# Patient Record
Sex: Male | Born: 1980 | Race: White | Hispanic: No | Marital: Married | Smoking: Never smoker
Health system: Southern US, Community
[De-identification: ages and names within clinical notes are randomized; demographics above are authoritative.]

## PROBLEM LIST (undated history)

## (undated) DIAGNOSIS — I10 Essential (primary) hypertension: Secondary | ICD-10-CM

## (undated) DIAGNOSIS — R42 Dizziness and giddiness: Secondary | ICD-10-CM

## (undated) DIAGNOSIS — T7840XA Allergy, unspecified, initial encounter: Secondary | ICD-10-CM

## (undated) HISTORY — PX: APPENDECTOMY: SHX54

## (undated) HISTORY — DX: Allergy, unspecified, initial encounter: T78.40XA

## (undated) HISTORY — DX: Essential (primary) hypertension: I10

## (undated) HISTORY — DX: Dizziness and giddiness: R42

## (undated) HISTORY — PX: VASECTOMY REVERSAL: SHX243

## (undated) HISTORY — PX: VASECTOMY: SHX75

---

## 2004-02-12 ENCOUNTER — Other Ambulatory Visit: Payer: Self-pay

## 2004-02-12 ENCOUNTER — Emergency Department: Payer: Self-pay | Admitting: Emergency Medicine

## 2005-12-23 ENCOUNTER — Ambulatory Visit: Payer: Self-pay | Admitting: Orthopedic Surgery

## 2006-10-13 ENCOUNTER — Other Ambulatory Visit: Payer: Self-pay

## 2006-10-14 ENCOUNTER — Inpatient Hospital Stay: Payer: Self-pay | Admitting: Internal Medicine

## 2006-12-27 ENCOUNTER — Ambulatory Visit: Payer: Self-pay | Admitting: Specialist

## 2007-01-11 ENCOUNTER — Ambulatory Visit: Payer: Self-pay | Admitting: Specialist

## 2007-02-10 ENCOUNTER — Ambulatory Visit: Payer: Self-pay | Admitting: Internal Medicine

## 2007-02-10 ENCOUNTER — Ambulatory Visit: Payer: Self-pay | Admitting: General Surgery

## 2012-07-12 ENCOUNTER — Ambulatory Visit: Payer: Self-pay | Admitting: Family Medicine

## 2014-12-10 ENCOUNTER — Encounter: Payer: Self-pay | Admitting: Family Medicine

## 2014-12-10 ENCOUNTER — Ambulatory Visit
Admission: RE | Admit: 2014-12-10 | Discharge: 2014-12-10 | Disposition: A | Discharge status: Home / Self Care | Payer: 59 | Source: Ambulatory Visit | Attending: Family Medicine | Admitting: Family Medicine

## 2014-12-10 ENCOUNTER — Ambulatory Visit: Payer: Self-pay | Admitting: Family Medicine

## 2014-12-10 ENCOUNTER — Ambulatory Visit (INDEPENDENT_AMBULATORY_CARE_PROVIDER_SITE_OTHER): Payer: 59 | Admitting: Family Medicine

## 2014-12-10 VITALS — BP 148/88 | HR 79 | Temp 98.0°F | Resp 16 | Ht 67.0 in | Wt 248.2 lb

## 2014-12-10 DIAGNOSIS — IMO0001 Reserved for inherently not codable concepts without codable children: Secondary | ICD-10-CM

## 2014-12-10 DIAGNOSIS — S39012A Strain of muscle, fascia and tendon of lower back, initial encounter: Secondary | ICD-10-CM

## 2014-12-10 DIAGNOSIS — S338XXA Sprain of other parts of lumbar spine and pelvis, initial encounter: Secondary | ICD-10-CM

## 2014-12-10 DIAGNOSIS — S3992XS Unspecified injury of lower back, sequela: Secondary | ICD-10-CM | POA: Diagnosis not present

## 2014-12-10 DIAGNOSIS — L255 Unspecified contact dermatitis due to plants, except food: Secondary | ICD-10-CM

## 2014-12-10 DIAGNOSIS — R030 Elevated blood-pressure reading, without diagnosis of hypertension: Secondary | ICD-10-CM | POA: Diagnosis not present

## 2014-12-10 DIAGNOSIS — X58XXXA Exposure to other specified factors, initial encounter: Secondary | ICD-10-CM | POA: Diagnosis not present

## 2014-12-10 MED ORDER — CYCLOBENZAPRINE HCL 10 MG PO TABS: 10.0000 mg | ORAL_TABLET | Freq: Three times a day (TID) | ORAL | Status: DC | PRN

## 2014-12-10 MED ORDER — TRIAMCINOLONE ACETONIDE 0.1 % EX CREA: 1.0000 "application " | TOPICAL_CREAM | Freq: Two times a day (BID) | CUTANEOUS | Status: AC

## 2014-12-10 MED ORDER — MELOXICAM 7.5 MG PO TABS: 15.0000 mg | ORAL_TABLET | Freq: Every day | ORAL | Status: DC

## 2014-12-10 NOTE — Patient Instructions (Addendum)
If you develop progressive numbness, weakness, loss of feeling in your pelvis, or loss of bowel/bladder control please seek immediate medical attention.   We discussed potential pathology and long term risk of reoccurrence. Maintaining an ideal body habitus, regular exercise, proper lifting techniques and mindfulness of exacerbating factors will be useful in long term management.  Instructed on use of heating pad with exercises. Consider concomitant therapy with PT, massage therapist or chiropractor. May use anti-inflammatory medication and muscle relaxer as needed.  Back Exercises These exercises may help you when beginning to rehabilitate your injury. Your symptoms may resolve with or without further involvement from your physician, physical therapist or athletic trainer. While completing these exercises, remember:   Restoring tissue flexibility helps normal motion to return to the joints. This allows healthier, less painful movement and activity.  An effective stretch should be held for at least 30 seconds.  A stretch should never be painful. You should only feel a gentle lengthening or release in the stretched tissue. STRETCH - Extension, Prone on Elbows   Lie on your stomach on the floor, a bed will be too soft. Place your palms about shoulder width apart and at the height of your head.  Place your elbows under your shoulders. If this is too painful, stack pillows under your chest.  Allow your body to relax so that your hips drop lower and make contact more completely with the floor.  Hold this position for __________ seconds.  Slowly return to lying flat on the floor. Repeat __________ times. Complete this exercise __________ times per day.  RANGE OF MOTION - Extension, Prone Press Ups   Lie on your stomach on the floor, a bed will be too soft. Place your palms about shoulder width apart and at the height of your head.  Keeping your back as relaxed as possible, slowly straighten  your elbows while keeping your hips on the floor. You may adjust the placement of your hands to maximize your comfort. As you gain motion, your hands will come more underneath your shoulders.  Hold this position __________ seconds.  Slowly return to lying flat on the floor. Repeat __________ times. Complete this exercise __________ times per day.  RANGE OF MOTION- Quadruped, Neutral Spine   Assume a hands and knees position on a firm surface. Keep your hands under your shoulders and your knees under your hips. You may place padding under your knees for comfort.  Drop your head and point your tail bone toward the ground below you. This will round out your low back like an angry cat. Hold this position for __________ seconds.  Slowly lift your head and release your tail bone so that your back sags into a large arch, like an old horse.  Hold this position for __________ seconds.  Repeat this until you feel limber in your low back.  Now, find your "sweet spot." This will be the most comfortable position somewhere between the two previous positions. This is your neutral spine. Once you have found this position, tense your stomach muscles to support your low back.  Hold this position for __________ seconds. Repeat __________ times. Complete this exercise __________ times per day.  STRETCH - Flexion, Single Knee to Chest   Lie on a firm bed or floor with both legs extended in front of you.  Keeping one leg in contact with the floor, bring your opposite knee to your chest. Hold your leg in place by either grabbing behind your thigh or at your  knee.  Pull until you feel a gentle stretch in your low back. Hold __________ seconds.  Slowly release your grasp and repeat the exercise with the opposite side. Repeat __________ times. Complete this exercise __________ times per day.  STRETCH - Hamstrings, Standing  Stand or sit and extend your right / left leg, placing your foot on a chair or foot  stool  Keeping a slight arch in your low back and your hips straight forward.  Lead with your chest and lean forward at the waist until you feel a gentle stretch in the back of your right / left knee or thigh. (When done correctly, this exercise requires leaning only a small distance.)  Hold this position for __________ seconds. Repeat __________ times. Complete this stretch __________ times per day. STRENGTHENING - Deep Abdominals, Pelvic Tilt   Lie on a firm bed or floor. Keeping your legs in front of you, bend your knees so they are both pointed toward the ceiling and your feet are flat on the floor.  Tense your lower abdominal muscles to press your low back into the floor. This motion will rotate your pelvis so that your tail bone is scooping upwards rather than pointing at your feet or into the floor.  With a gentle tension and even breathing, hold this position for __________ seconds. Repeat __________ times. Complete this exercise __________ times per day.  STRENGTHENING - Abdominals, Crunches   Lie on a firm bed or floor. Keeping your legs in front of you, bend your knees so they are both pointed toward the ceiling and your feet are flat on the floor. Cross your arms over your chest.  Slightly tip your chin down without bending your neck.  Tense your abdominals and slowly lift your trunk high enough to just clear your shoulder blades. Lifting higher can put excessive stress on the low back and does not further strengthen your abdominal muscles.  Control your return to the starting position. Repeat __________ times. Complete this exercise __________ times per day.  STRENGTHENING - Quadruped, Opposite UE/LE Lift   Assume a hands and knees position on a firm surface. Keep your hands under your shoulders and your knees under your hips. You may place padding under your knees for comfort.  Find your neutral spine and gently tense your abdominal muscles so that you can maintain this  position. Your shoulders and hips should form a rectangle that is parallel with the floor and is not twisted.  Keeping your trunk steady, lift your right hand no higher than your shoulder and then your left leg no higher than your hip. Make sure you are not holding your breath. Hold this position __________ seconds.  Continuing to keep your abdominal muscles tense and your back steady, slowly return to your starting position. Repeat with the opposite arm and leg. Repeat __________ times. Complete this exercise __________ times per day. Document Released: 04/24/2005 Document Revised: 06/29/2011 Document Reviewed: 07/19/2008 [facility] Patient Information 2015 [place], [place]. This information is not intended to replace advice given to you by your health care provider. Make sure you discuss any questions you have with your health care provider. Back Exercises These exercises may help you when beginning to rehabilitate your injury. Your symptoms may resolve with or without further involvement from your physician, physical therapist or athletic trainer. While completing these exercises, remember:   Restoring tissue flexibility helps normal motion to return to the joints. This allows healthier, less painful movement and activity.  An effective stretch  should be held for at least 30 seconds.  A stretch should never be painful. You should only feel a gentle lengthening or release in the stretched tissue. STRETCH - Extension, Prone on Elbows   Lie on your stomach on the floor, a bed will be too soft. Place your palms about shoulder width apart and at the height of your head.  Place your elbows under your shoulders. If this is too painful, stack pillows under your chest.  Allow your body to relax so that your hips drop lower and make contact more completely with the floor.  Hold this position for __________ seconds.  Slowly return to lying flat on the floor. Repeat __________ times. Complete this  exercise __________ times per day.  RANGE OF MOTION - Extension, Prone Press Ups   Lie on your stomach on the floor, a bed will be too soft. Place your palms about shoulder width apart and at the height of your head.  Keeping your back as relaxed as possible, slowly straighten your elbows while keeping your hips on the floor. You may adjust the placement of your hands to maximize your comfort. As you gain motion, your hands will come more underneath your shoulders.  Hold this position __________ seconds.  Slowly return to lying flat on the floor. Repeat __________ times. Complete this exercise __________ times per day.  RANGE OF MOTION- Quadruped, Neutral Spine   Assume a hands and knees position on a firm surface. Keep your hands under your shoulders and your knees under your hips. You may place padding under your knees for comfort.  Drop your head and point your tail bone toward the ground below you. This will round out your low back like an angry cat. Hold this position for __________ seconds.  Slowly lift your head and release your tail bone so that your back sags into a large arch, like an old horse.  Hold this position for __________ seconds.  Repeat this until you feel limber in your low back.  Now, find your "sweet spot." This will be the most comfortable position somewhere between the two previous positions. This is your neutral spine. Once you have found this position, tense your stomach muscles to support your low back.  Hold this position for __________ seconds. Repeat __________ times. Complete this exercise __________ times per day.  STRETCH - Flexion, Single Knee to Chest   Lie on a firm bed or floor with both legs extended in front of you.  Keeping one leg in contact with the floor, bring your opposite knee to your chest. Hold your leg in place by either grabbing behind your thigh or at your knee.  Pull until you feel a gentle stretch in your low back. Hold __________  seconds.  Slowly release your grasp and repeat the exercise with the opposite side. Repeat __________ times. Complete this exercise __________ times per day.  STRETCH - Hamstrings, Standing  Stand or sit and extend your right / left leg, placing your foot on a chair or foot stool  Keeping a slight arch in your low back and your hips straight forward.  Lead with your chest and lean forward at the waist until you feel a gentle stretch in the back of your right / left knee or thigh. (When done correctly, this exercise requires leaning only a small distance.)  Hold this position for __________ seconds. Repeat __________ times. Complete this stretch __________ times per day. STRENGTHENING - Deep Abdominals, Pelvic Tilt   Lorenz Coaster  on a firm bed or floor. Keeping your legs in front of you, bend your knees so they are both pointed toward the ceiling and your feet are flat on the floor.  Tense your lower abdominal muscles to press your low back into the floor. This motion will rotate your pelvis so that your tail bone is scooping upwards rather than pointing at your feet or into the floor.  With a gentle tension and even breathing, hold this position for __________ seconds. Repeat __________ times. Complete this exercise __________ times per day.  STRENGTHENING - Abdominals, Crunches   Lie on a firm bed or floor. Keeping your legs in front of you, bend your knees so they are both pointed toward the ceiling and your feet are flat on the floor. Cross your arms over your chest.  Slightly tip your chin down without bending your neck.  Tense your abdominals and slowly lift your trunk high enough to just clear your shoulder blades. Lifting higher can put excessive stress on the low back and does not further strengthen your abdominal muscles.  Control your return to the starting position. Repeat __________ times. Complete this exercise __________ times per day.  STRENGTHENING - Quadruped, Opposite UE/LE  Lift   Assume a hands and knees position on a firm surface. Keep your hands under your shoulders and your knees under your hips. You may place padding under your knees for comfort.  Find your neutral spine and gently tense your abdominal muscles so that you can maintain this position. Your shoulders and hips should form a rectangle that is parallel with the floor and is not twisted.  Keeping your trunk steady, lift your right hand no higher than your shoulder and then your left leg no higher than your hip. Make sure you are not holding your breath. Hold this position __________ seconds.  Continuing to keep your abdominal muscles tense and your back steady, slowly return to your starting position. Repeat with the opposite arm and leg. Repeat __________ times. Complete this exercise __________ times per day. Document Released: 04/24/2005 Document Revised: 06/29/2011 Document Reviewed: 07/19/2008 [facility] Patient Information 2015 [place], [place]. This information is not intended to replace advice given to you by your health care provider. Make sure you discuss any questions you have with your health care provider.

## 2014-12-10 NOTE — Progress Notes (Signed)
Subjective:    Patient ID: Jack Lewis, male    DOB: 01-08-1981, 34 y.o.   MRN: [id number]  HPI: Jack Lewis is a 34 y.o. male presenting on 12/10/2014 for Back Pain and Rash   Back Pain This is a recurrent problem. The current episode started more than 1 month ago. The problem occurs intermittently. The problem has been gradually worsening since onset. The pain is present in the lumbar spine. The quality of the pain is described as aching. The pain does not radiate. The pain is at a severity of 7/10. The pain is worse during the day. The symptoms are aggravated by standing. Pertinent negatives include no abdominal pain, bladder incontinence, bowel incontinence, chest pain, fever, numbness, paresthesias, pelvic pain, perianal numbness or weakness. Risk factors include recent trauma. He has tried bed rest and heat for the symptoms. The treatment provided mild relief.  Rash The current episode started in the past 7 days. The problem has been gradually improving since onset. The affected locations include the neck. The rash is characterized by redness and itchiness. He was exposed to plant contact. Pertinent negatives include no congestion, diarrhea, eye pain, facial edema, fever, joint pain, rhinorrhea, shortness of breath, sore throat or vomiting. Past treatments include anti-itch cream.   Pt presents for 6 mos history of back pain. 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 Back was snapped back at lumbar section. Pt reports intermittent back pain since that point. Episode of back pain are getting more frequent. Pain is located in the mid back. Denies numbness or tingling, loss of bowel or bladder control. Standing in the worst position. Pt is using pillow   Past Medical History  Diagnosis Date  . Allergy   . Vertigo     No current outpatient prescriptions on file prior to visit.   No current facility-administered medications on file prior to visit.    Review  of Systems  Constitutional: Negative for fever.  HENT: Negative for congestion, rhinorrhea and sore throat.   Eyes: Negative for pain.  Respiratory: Negative for shortness of breath.   Cardiovascular: Negative for chest pain.  Gastrointestinal: Negative for vomiting, abdominal pain, diarrhea and bowel incontinence.  Genitourinary: Negative for bladder incontinence and pelvic pain.  Musculoskeletal: Positive for back pain. Negative for joint pain.  Skin: Positive for rash.  Neurological: Negative for dizziness, weakness, numbness and paresthesias.   Per HPI unless specifically indicated above     Objective:    BP 148/88 mmHg  Pulse 79  Temp(Src) 98 F (36.7 C) (Oral)  Resp 16  Ht 5\' 7"  (1.702 m)  Wt 248 lb 3.2 oz (112.583 kg)  BMI 38.86 kg/m2  Wt Readings from Last 3 Encounters:  12/10/14 248 lb 3.2 oz (112.583 kg)    Physical Exam  Constitutional: He is oriented to person, place, and time. He appears well-developed and well-nourished. No distress.  Pulmonary/Chest: Effort normal and breath sounds normal. He has no wheezes. He exhibits no tenderness.  Musculoskeletal:       Lumbar back: He exhibits tenderness. He exhibits normal range of motion, no edema and no pain.       Back:  Neurological: He is alert and oriented to person, place, and time. He has normal strength and normal reflexes. No cranial nerve deficit or sensory deficit. He displays a negative Romberg sign.  Skin: He is not diaphoretic.   No results found for this or any previous visit.  Assessment & Plan:   Problem List Items Addressed This Visit    None    Visit Diagnoses    Lumbosacral strain, initial encounter    -  Primary    Supportive care- NSAIDs, muscle relaxers. Referral to PT placed. Pt also would like to see chiropractor. Encourage core strengthening. Rest, heat, massage.    Relevant Medications    meloxicam (MOBIC) 7.5 MG tablet    cyclobenzaprine (FLEXERIL) 10 MG tablet    Other Relevant  Orders    DG Lumbar Spine Complete    Contact dermatitis due to plant        Kenalog cream BID for 1 week.     Relevant Medications    triamcinolone cream (KENALOG) 0.1 %    Back injuries, sequela        XR to rule out bone injury from initial snap from work recliner.     Elevated BP        Likely elevated due to pain. Encouraged pt to continue to monitor BP at home. Encouraged healthy weight and avoidance of salt.        Meds ordered this encounter  Medications  . fexofenadine (ALLEGRA) 30 MG tablet    Sig: Take 30 mg by mouth daily.  Marland Kitchen triamcinolone cream (KENALOG) 0.1 %    Sig: Apply 1 application topically 2 (two) times daily.    Dispense:  30 g    Refill:  0    Order Specific Question:  Supervising Provider    Answer:  Janeann Forehand [[id number]]  . meloxicam (MOBIC) 7.5 MG tablet    Sig: Take 2 tablets (15 mg total) by mouth daily.    Dispense:  60 tablet    Refill:  0    Order Specific Question:  Supervising Provider    Answer:  Janeann Forehand [phone]  . cyclobenzaprine (FLEXERIL) 10 MG tablet    Sig: Take 1 tablet (10 mg total) by mouth 3 (three) times daily as needed for muscle spasms.    Dispense:  30 tablet    Refill:  1    Order Specific Question:  Supervising Provider    Answer:  Janeann Forehand [phone]      Follow up plan: Return if symptoms worsen or fail to improve, for back pain.

## 2014-12-11 ENCOUNTER — Telehealth: Payer: Self-pay

## 2014-12-11 NOTE — Telephone Encounter (Signed)
Advised XRay Results.

## 2014-12-11 NOTE — Telephone Encounter (Signed)
-----   Message from Loura Pardon, NP sent at 12/11/2014 10:26 AM EDT ----- Please let Jack Lewis know that his back XR showed no sign of fracture of degenerative changes. His symptoms are consistently with muscular strain. We will continue with plan of care discussed at his visit. Thanks! AK

## 2015-07-30 ENCOUNTER — Ambulatory Visit (INDEPENDENT_AMBULATORY_CARE_PROVIDER_SITE_OTHER): Payer: 59 | Admitting: Family Medicine

## 2015-07-30 ENCOUNTER — Encounter: Payer: Self-pay | Admitting: Family Medicine

## 2015-07-30 VITALS — BP 150/90 | HR 75 | Temp 98.2°F | Resp 16 | Ht 67.0 in | Wt 256.0 lb

## 2015-07-30 DIAGNOSIS — I10 Essential (primary) hypertension: Secondary | ICD-10-CM | POA: Insufficient documentation

## 2015-07-30 DIAGNOSIS — Z0000 Encounter for general adult medical examination without abnormal findings: Secondary | ICD-10-CM

## 2015-07-30 MED ORDER — LISINOPRIL 10 MG PO TABS: 10.0000 mg | ORAL_TABLET | Freq: Every day | ORAL | Status: DC

## 2015-07-30 NOTE — Progress Notes (Signed)
Name: Jack Lewis   MRN: [id number]    DOB: [birth year]   Date:07/30/2015       Progress Note  Subjective  Chief Complaint  Chief Complaint  Patient presents with  . Annual Exam    HPI Here for health maintenance physical.  His only problem is low testosterone level and he and his wife have infertility problems.  He suffers from seasonal allergies.  He has been told that his BP has been a little high at recent office visits.  No problem-specific assessment & plan notes found for this encounter.   Past Medical History  Diagnosis Date  . Allergy   . Vertigo     History reviewed. No pertinent past surgical history.  Family History  Problem Relation Age of Onset  . Hypertension Mother   . Diabetes Father   . Hypertension Father     Social History   Social History  . Marital Status: Married    Spouse Name: N/A  . Number of Children: N/A  . Years of Education: N/A   Occupational History  . Not on file.   Social History Main Topics  . Smoking status: Never Smoker   . Smokeless tobacco: Never Used  . Alcohol Use: 0.0 oz/week    0 Standard drinks or equivalent per week  . Drug Use: No  . Sexual Activity: Not on file   Other Topics Concern  . Not on file   Social History Narrative     Current outpatient prescriptions:  .  clomiPHENE (CLOMID) 50 MG tablet, Take 50 mg by mouth daily., Disp: , Rfl:  .  cyclobenzaprine (FLEXERIL) 10 MG tablet, Take 1 tablet (10 mg total) by mouth 3 (three) times daily as needed for muscle spasms. (Patient not taking: Reported on 07/30/2015), Disp: 30 tablet, Rfl: 1 .  fexofenadine (ALLEGRA) 30 MG tablet, Take 30 mg by mouth daily. Reported on 07/30/2015, Disp: , Rfl:  .  lisinopril (PRINIVIL,ZESTRIL) 10 MG tablet, Take 1 tablet (10 mg total) by mouth daily., Disp: 30 tablet, Rfl: 6 .  meloxicam (MOBIC) 7.5 MG tablet, Take 2 tablets (15 mg total) by mouth daily. (Patient not taking: Reported on 07/30/2015), Disp: 60 tablet, Rfl: 0  No  Known Allergies   Review of Systems  Constitutional: Negative for fever, chills, weight loss and malaise/fatigue.  HENT: Negative for hearing loss.   Eyes: Negative for blurred vision and double vision.  Respiratory: Negative for cough, shortness of breath and wheezing.   Cardiovascular: Negative for chest pain, palpitations and leg swelling.  Gastrointestinal: Negative for heartburn, abdominal pain and blood in stool.  Genitourinary: Negative for dysuria, urgency and frequency.  Skin: Negative for rash.  Neurological: Negative for dizziness, tremors, weakness and headaches.      Objective  Filed Vitals:   07/30/15 1014 07/30/15 1049  BP: 149/84 150/90  Pulse: 75   Temp: 98.2 F (36.8 C)   TempSrc: Oral   Resp: 16   Height:  (1.702 m)   Weight: 256 lb (116.121 kg)     Physical Exam  Constitutional: He is well-developed, well-nourished, and in no distress. No distress.  HENT:  Head: Normocephalic and atraumatic.  Right Ear: External ear normal.  Left Ear: External ear normal.  Nose: Nose normal.  Mouth/Throat: Oropharynx is clear and moist.  Eyes: Conjunctivae and EOM are normal. Pupils are equal, round, and reactive to light. No scleral icterus.  Neck: Normal range of motion. Neck supple. Carotid bruit is not present.  No thyromegaly present.  Cardiovascular: Normal rate, regular rhythm, normal heart sounds and intact distal pulses.  Exam reveals no gallop and no friction rub.   No murmur heard. Pulmonary/Chest: Effort normal and breath sounds normal. No respiratory distress. He has no wheezes. He has no rales.  Abdominal: Soft. Bowel sounds are normal. He exhibits no distension, no abdominal bruit and no mass. There is no tenderness.  Genitourinary: Penis normal. No discharge found.  Musculoskeletal: Normal range of motion. He exhibits no edema.  Lymphadenopathy:    He has no cervical adenopathy.  Neurological: He is alert. No cranial nerve deficit. Gait normal.  Coordination normal.  Skin: Skin is warm and dry. No rash noted. No erythema. No pallor.  Psychiatric: Mood, memory, affect and judgment normal.  Vitals reviewed.      No results found for this or any previous visit (from the past 2160 hour(s)).   Assessment & Plan  Problem List Items Addressed This Visit      Cardiovascular and Mediastinum   HBP (high blood pressure)   Relevant Medications   lisinopril (PRINIVIL,ZESTRIL) 10 MG tablet     Other   Health maintenance examination - Primary      Meds ordered this encounter  Medications  . clomiPHENE (CLOMID) 50 MG tablet    Sig: Take 50 mg by mouth daily.  Marland Kitchen. lisinopril (PRINIVIL,ZESTRIL) 10 MG tablet    Sig: Take 1 tablet (10 mg total) by mouth daily.    Dispense:  30 tablet    Refill:  6  1. Health maintenance examination   2. Essential hypertension  - lisinopril (PRINIVIL,ZESTRIL) 10 MG tablet; Take 1 tablet (10 mg total) by mouth daily.  Dispense: 30 tablet; Refill: 6 RTC-6 weeks

## 2015-09-05 ENCOUNTER — Ambulatory Visit: Payer: 59 | Admitting: Family Medicine

## 2015-09-24 ENCOUNTER — Ambulatory Visit (INDEPENDENT_AMBULATORY_CARE_PROVIDER_SITE_OTHER): Payer: 59 | Admitting: Family Medicine

## 2015-09-24 ENCOUNTER — Encounter: Payer: Self-pay | Admitting: Family Medicine

## 2015-09-24 VITALS — BP 135/85 | HR 75 | Temp 98.3°F | Resp 16 | Ht 67.0 in | Wt 244.0 lb

## 2015-09-24 DIAGNOSIS — I10 Essential (primary) hypertension: Secondary | ICD-10-CM

## 2015-09-24 NOTE — Progress Notes (Signed)
Name: Jack Lewis   MRN: [id number]    DOB: 06/24/1980   Date:09/24/2015       Progress Note  Subjective  Chief Complaint  Chief Complaint  Patient presents with  . Hypertension    HPI Here for f/u of HBP.  He stopped Lisinopril after a few dayhs because he said it made him feel tired.  He has lost weight by changing his diet.  No problem-specific assessment & plan notes found for this encounter.   Past Medical History  Diagnosis Date  . Allergy   . Vertigo     History reviewed. No pertinent past surgical history.  Family History  Problem Relation Age of Onset  . Hypertension Mother   . Diabetes Father   . Hypertension Father     Social History   Social History  . Marital Status: Married    Spouse Name: N/A  . Number of Children: N/A  . Years of Education: N/A   Occupational History  . Not on file.   Social History Main Topics  . Smoking status: Never Smoker   . Smokeless tobacco: Never Used  . Alcohol Use: 0.0 oz/week    0 Standard drinks or equivalent per week  . Drug Use: No  . Sexual Activity: Not on file   Other Topics Concern  . Not on file   Social History Narrative     Current outpatient prescriptions:  .  clomiPHENE (CLOMID) 50 MG tablet, Take 50 mg by mouth daily., Disp: , Rfl:  .  fexofenadine (ALLEGRA) 30 MG tablet, Take 30 mg by mouth daily. Reported on 07/30/2015, Disp: , Rfl:  .  cyclobenzaprine (FLEXERIL) 10 MG tablet, Take 1 tablet (10 mg total) by mouth 3 (three) times daily as needed for muscle spasms. (Patient not taking: Reported on 07/30/2015), Disp: 30 tablet, Rfl: 1  No Known Allergies   Review of Systems  Constitutional: Negative for fever, chills, weight loss and malaise/fatigue.  HENT: Negative for hearing loss.   Eyes: Negative for blurred vision and double vision.  Respiratory: Negative for cough, shortness of breath and wheezing.   Cardiovascular: Negative for chest pain, palpitations and leg swelling.   Gastrointestinal: Negative for heartburn, abdominal pain and blood in stool.  Genitourinary: Negative for dysuria, urgency and flank pain.  Musculoskeletal: Negative for myalgias and joint pain.  Skin: Negative for rash.  Neurological: Negative for dizziness, tremors, weakness and headaches.      Objective  Filed Vitals:   09/24/15 1411 09/24/15 1436  BP: 141/77 135/85  Pulse: 75   Temp: 98.3 F (36.8 C)   TempSrc: Oral   Resp: 16   Height:  (1.702 m)   Weight: 244 lb (110.678 kg)     Physical Exam  Constitutional: He is oriented to person, place, and time and well-developed, well-nourished, and in no distress. No distress.  HENT:  Head: Normocephalic and atraumatic.  Eyes: Conjunctivae and EOM are normal. Pupils are equal, round, and reactive to light. No scleral icterus.  Neck: Normal range of motion. Neck supple. Carotid bruit is not present. No thyromegaly present.  Cardiovascular: Normal rate, regular rhythm and normal heart sounds.  Exam reveals no gallop and no friction rub.   No murmur heard. Pulmonary/Chest: Effort normal and breath sounds normal. No respiratory distress. He has no wheezes. He has no rales.  Abdominal: Soft. Bowel sounds are normal. He exhibits no distension and no mass. There is no tenderness.  Musculoskeletal: He exhibits no edema.  Lymphadenopathy:    He has no cervical adenopathy.  Neurological: He is alert and oriented to person, place, and time.  Vitals reviewed.      No results found for this or any previous visit (from the past 2160 hour(s)).   Assessment & Plan  Problem List Items Addressed This Visit      Cardiovascular and Mediastinum   HBP (high blood pressure) - Primary      No orders of the defined types were placed in this encounter.   1. Essential hypertension May continue off medication at this time.

## 2016-01-09 ENCOUNTER — Ambulatory Visit: Payer: 59 | Admitting: Family Medicine

## 2016-11-23 ENCOUNTER — Encounter: Payer: Self-pay | Admitting: Nurse Practitioner

## 2016-11-23 ENCOUNTER — Ambulatory Visit (INDEPENDENT_AMBULATORY_CARE_PROVIDER_SITE_OTHER): Payer: 59 | Admitting: Nurse Practitioner

## 2016-11-23 VITALS — BP 137/76 | HR 67 | Temp 98.0°F | Ht 67.0 in | Wt 251.2 lb

## 2016-11-23 DIAGNOSIS — M79661 Pain in right lower leg: Secondary | ICD-10-CM

## 2016-11-23 DIAGNOSIS — B372 Candidiasis of skin and nail: Secondary | ICD-10-CM

## 2016-11-23 DIAGNOSIS — Z6839 Body mass index (BMI) 39.0-39.9, adult: Secondary | ICD-10-CM

## 2016-11-23 MED ORDER — CLOTRIMAZOLE 1 % EX CREA: 1.0000 "application " | TOPICAL_CREAM | Freq: Two times a day (BID) | CUTANEOUS | 0 refills | Status: DC

## 2016-11-23 NOTE — Progress Notes (Signed)
I have reviewed this encounter including the documentation in this note and/or discussed this patient with the provider, Wilhelmina McardleLauren Kennedy, AGPCNP-BC. I am certifying that I agree with the content of this note as supervising physician.  Saralyn PilarAlexander Karamalegos, DO [facility] 11/23/2016, 12:28 PM

## 2016-11-23 NOTE — Progress Notes (Signed)
Subjective:    Patient ID: Jack Lewis, male    DOB: 08/16/1980, 36 y.o.   MRN: [id number]  Jack Lewis is a 36 y.o. male presenting on 11/23/2016 for Blister (pt states he got a blister on the left great toe x 1 week. ) and Leg Pain (intermittent muscle cramp in the left calf x 1 mth)   HPI  Left Great Toe Erythema Blister appeared inside of great left toe 1 week ago. Had been wearing flip-flops but only for short distances at home. Then noted new callus or blister on left first MTP joint 2 days later.  Then noted that the blister/ rash spread across top of toe w/ associated erythema.  He has applied triamcinolone ointment previously prescribed by Dr. Juanetta Gosling to his toe and has noted improvement in the redness.  Pt denies fever, chills, sweats, nausea, vomiting, diarrhea and constipation.  Denies localized warmth, purulent drainage.   Right Calf Pain Pt notes pain in right calf w/ sensations of leg being swollen and tight.  No significant limitation w/ activity.  Pt does stand/sit for prolonged periods of time. Denies significant swelling.  Pain has lasted 3-4 weeks.  Obesity Pt notes he is overweight and wants to lose weight.  Dr. Juanetta Gosling has previously encouraged him to lose weight, but he hasn't been successful.  States desire to start.  Social History  Substance Use Topics  . Smoking status: Never Smoker  . Smokeless tobacco: Never Used  . Alcohol use 0.0 oz/week    Review of Systems Per HPI unless specifically indicated above     Objective:    BP 137/76 (BP Location: Right Arm, Patient Position: Sitting, Cuff Size: Large)   Pulse 67   Temp 98 F (36.7 C) (Oral)   Ht 5\' 7"  (1.702 m)   Wt 251 lb 3.2 oz (113.9 kg)   BMI 39.34 kg/m   Wt Readings from Last 3 Encounters:  11/23/16 251 lb 3.2 oz (113.9 kg)  09/24/15 244 lb (110.7 kg)  07/30/15 256 lb (116.1 kg)    Physical Exam  Constitutional: He appears well-developed and well-nourished. No distress.  HENT:    Head: Normocephalic and atraumatic.  Cardiovascular: Normal rate, regular rhythm and normal heart sounds.   Pulmonary/Chest: Effort normal and breath sounds normal. No respiratory distress.  Musculoskeletal:       Right knee: Normal.       Right ankle: Normal.       Right lower leg: He exhibits swelling. He exhibits no tenderness, no bony tenderness, no edema, no deformity and no laceration.       Left foot: There is normal range of motion, no tenderness, no bony tenderness, no swelling, normal capillary refill, no crepitus, no deformity and no laceration.  Skin: Skin is warm and dry.        No results found for this or any previous visit.    Assessment & Plan:   Problem List Items Addressed This Visit    None    Visit Diagnoses    Candidiasis of skin    -  Primary Localized erythema w/ skin breakdown of left great toe. Possible cellulitis vs candidiasis.  Improvement w/ topical triamcinolone.  Difficult to assess cause.  Plan: 1. May continue triamcinolone application for up to 2 weeks total. 2. START using clotrimazole cream for 2 weeks. 3. If no improvement or worsening after treatment, probable cellulitis.  Call for antibiotic prescription. Pt verbalizes understanding. 4. Follow up 7-14 days  as needed or sooner if worsening symptoms.   Relevant Medications   clotrimazole (LOTRIMIN) 1 % cream   BMI 39.0-39.9,adult     Obese status.  Pt expresses desire to lose weight, but does not want to follow fad diet.  Plan: 1. Start with food log.  Find places to cut back.  Reduce overall serving sizes. 2. Recommended resources MyFitnessPal and http://www.wall-moore.info/MyPlate.gov 3. Follow up at annual physical in 2-3 months.    Right calf pain     Subacute.  Likely related to muscle strain vs edema vs claudication.    Plan: 1. Start w/ stretching of muscle 2x daily. Demonstrated stretches. 2. May wear compression socks to reduce edema. 3. If no improvement in 2-4 weeks, return to clinic. Perform  intermittent claudication review and evaluation at that time. 4. Follow up as needed.      Meds ordered this encounter  Medications  . loratadine (CLARITIN) 10 MG tablet    Sig: Take 10 mg by mouth daily.  . clotrimazole (LOTRIMIN) 1 % cream    Sig: Apply 1 application topically 2 (two) times daily.    Dispense:  30 g    Refill:  0    Order Specific Question:   Supervising Provider    Answer:   Smitty CordsKARAMALEGOS, ALEXANDER J [[id number]]      Follow up plan: Return 7-10 days if symptoms worsen or fail to improve AND annual physical in about 2-3 months.   Wilhelmina McardleLauren Kiyona Mcnall, DNP, AGPCNP-BC Adult Gerontology Primary Care Nurse Practitioner [facility] 11/23/2016, 10:40 AM

## 2016-11-23 NOTE — Patient Instructions (Addendum)
Jack Lewis, Thank you for coming in to clinic today.  1. For your left great toe: - Continue triamcinolone cream up to 2 weeks. - START clotrimazole 1% cream on your toe for 2 weeks.  2. For your calf: - stretching recommeded 2x day  - compression socks (15-20 mmHg) [facility] - Return to clinic if not improving 2-4 weeks.  3. For weight loss:  - start with a food log.  Then find places to cut back.  Reduce overall serving sizes.    Please schedule a follow-up appointment with Wilhelmina McardleLauren Yeriel Mineo, AGNP. Return 7-10 days if symptoms worsen or fail to improve AND annual physical in about 2-3 months.  If you have any other questions or concerns, please feel free to call the clinic or send a message through MyChart. You may also schedule an earlier appointment if necessary.  You will receive a survey after today's visit either digitally by e-mail or paper by [employer] mail. Your experiences and feedback matter to us.  Please respond so we know how we are doing as we provide care for you.   Wilhelmina McardleLauren Zaiah Credeur, DNP, AGNP-BC Adult Gerontology Nurse Practitioner [facility], [facility]

## 2017-02-02 ENCOUNTER — Encounter: Payer: Self-pay | Admitting: Nurse Practitioner

## 2017-02-02 ENCOUNTER — Ambulatory Visit (INDEPENDENT_AMBULATORY_CARE_PROVIDER_SITE_OTHER): Payer: 59 | Admitting: Nurse Practitioner

## 2017-02-02 ENCOUNTER — Ambulatory Visit: Payer: 59 | Admitting: Nurse Practitioner

## 2017-02-02 VITALS — BP 138/76 | HR 72 | Temp 98.1°F | Ht 67.0 in | Wt 249.8 lb

## 2017-02-02 DIAGNOSIS — I10 Essential (primary) hypertension: Secondary | ICD-10-CM

## 2017-02-02 DIAGNOSIS — E6609 Other obesity due to excess calories: Secondary | ICD-10-CM

## 2017-02-02 DIAGNOSIS — Z6839 Body mass index (BMI) 39.0-39.9, adult: Secondary | ICD-10-CM | POA: Diagnosis not present

## 2017-02-02 DIAGNOSIS — Z0000 Encounter for general adult medical examination without abnormal findings: Secondary | ICD-10-CM

## 2017-02-02 DIAGNOSIS — E669 Obesity, unspecified: Secondary | ICD-10-CM | POA: Insufficient documentation

## 2017-02-02 NOTE — Patient Instructions (Addendum)
Lynx, Thank you for coming in to clinic today.  1. Your provider would like to you have your annual eye exam. Please contact your current eye doctor or here are some good options for you to contact.   [facility] Address: [address] [place], [place] [address]  Address: [address] [place], [place] [address]  Phone: [phone]     Phone: [phone]  Website: [email or web address]   Website: [email or web address]     [facility]  Address: [address] [place], [place], [place] [address]  Address: [address] [place], [place], [place] [address] Phone: [phone]     Phone: [phone]    [facility] Address: [address] [place], [place], [place] [address]  Phone: [phone]  2. START weight loss with a food log.  Work to decrease salt for blood pressure, increase vegetables, decrease sugars.  Low glycemic foods will have more fiber and keep you feeling fuller longer.   - Goal is 1/2 to 1 lb per week of weight loss.  This means eating meals out about 5 times less per week.  Please schedule a follow-up appointment with [name], AGNP. Return in about 1 year (around 02/02/2018) for annual physical.  If you have any other questions or concerns, please feel free to call the clinic or send a message through MyChart. You may also schedule an earlier appointment if necessary.  You will receive a survey after today's visit either digitally by e-mail or paper by [employer]. Your experiences and feedback matter to [place].  Please respond so we know how we are doing as we provide care for you.   [name], DNP, AGNP-BC Adult Gerontology Nurse Practitioner [facility], [facility]

## 2017-02-02 NOTE — Progress Notes (Signed)
Subjective:    Patient ID: Jack Lewis, male    DOB: 12-13-1980, 36 y.o.   MRN: [id number]  Jack Lewis is a 36 y.o. male presenting on 02/02/2017 for Annual Exam (appeal screening [facility] )   HPI Annual Physical Exam Patient has been feeling well.  They have no acute concerns today. Sleeps average 7 hours per night usually uninterrupted.  HEALTH MAINTENANCE: Weight/BMI: 39.12 and increasing over last 1 year w/ twin girls 33 mo old and needing convenience foods Physical activity: Rarely outside of work Diet: Eats out at restaurants frequently, occasional frozen meal (healthy choice), Has difficulty w/ protein meal replacement shakes (lactose consumption higher).  Lost 7 lbs and has maintained 3 lbs loss over last 2 months.   - Breakfast: 1-2 nutrigrain bars - [place] [employer] out for lunch (For prior weight loss, was replacing this meal w/ shake) - Snacking: 1 pack crackers between lunch and dinner - Convenience meal/Freezer meal dinner.   Seatbelt: always as passenger, not always as a driver Sunscreen: always w/ prolonged exposure HIV: declines today Optometry: not regularly, but desires exam Dentistry: Regular cleanings  VACCINES: Tetanus: up to date Influenza: declines  Past Medical History:  Diagnosis Date  . Allergy   . Vertigo    Past Surgical History:  Procedure Laterality Date  . APPENDECTOMY    . VASECTOMY    . VASECTOMY REVERSAL     Social History   Social History  . Marital status: Married    Spouse name: N/A  . Number of children: N/A  . Years of education: N/A   Occupational History  . Not on file.   Social History Main Topics  . Smoking status: Never Smoker  . Smokeless tobacco: Never Used  . Alcohol use 0.0 oz/week     Comment: occasional  . Drug use: No  . Sexual activity: Yes    Birth control/ protection: Surgical   Other Topics Concern  . Not on file   Social History Narrative  . No narrative on file   Family History    Problem Relation Age of Onset  . Hypertension Mother   . Diabetes Father   . Hypertension Father   . Heart attack Paternal Grandfather    Current Outpatient Prescriptions on File Prior to Visit  Medication Sig  . loratadine (CLARITIN) 10 MG tablet Take 10 mg by mouth daily.  . clotrimazole (LOTRIMIN) 1 % cream Apply 1 application topically 2 (two) times daily. (Patient not taking: Reported on 02/02/2017)   No current facility-administered medications on file prior to visit.     Review of Systems  Constitutional: Negative.   HENT: Negative.   Eyes: Negative.   Respiratory: Negative.   Cardiovascular: Negative.   Gastrointestinal: Negative.   Endocrine: Negative.   Genitourinary: Negative.   Musculoskeletal: Negative.   Skin: Negative.   Allergic/Immunologic: Negative.   Neurological: Negative.   Hematological: Negative.   Psychiatric/Behavioral: Negative.    Per HPI unless specifically indicated above     Objective:    BP 138/76 (BP Location: Right Arm, Patient Position: Sitting, Cuff Size: Large)   Pulse 72   Temp 98.1 F (36.7 C) (Oral)   Ht  (1.702 m)   Wt 249 lb 12.8 oz (113.3 kg)   BMI 39.12 kg/m   BP recheck 134/75  Wt Readings from Last 3 Encounters:  02/02/17 249 lb 12.8 oz (113.3 kgTHAD Lewis 251 lb 3.2 oz (113.9 kg)  09/24/15 244 lb (110.7  kg)    Physical Exam General - obese w/ central adiposity, well-appearing, NAD HEENT - Normocephalic, atraumatic, PERRL, EOMI, patent nares w/o congestion, oropharynx clear, MMM Neck - supple, non-tender, no LAD, no thyromegaly, no carotid bruit Heart - RRR, no murmurs heard Lungs - Clear throughout all lobes, no wheezing, crackles, or rhonchi. Normal work of breathing. Abdomen - soft, NTND, no masses, no hepatosplenomegaly, active bowel sounds GU - deferred by patient Extremeties - non-tender, no edema, cap refill < 2 seconds, peripheral pulses intact +2 bilaterally Skin - warm, dry, no rashes Neuro -  awake, alert, oriented x3, CN II-X intact, intact muscle strength 5/5 bilaterally, intact distal sensation to light touch, normal coordination, normal gait Psych - Normal mood and affect, normal behavior   Pt has had biometric screening w/ lab results including Lipid panel, Hgb A1c, Glucose and were WNL not requiring treatment for non-fasting labs  No results found for this or any previous visit.    Assessment & Plan:   Problem List Items Addressed This Visit      Cardiovascular and Mediastinum   HBP (high blood pressure)    Elevated BP reading today, but BP at goal of < 130/80 at last visit.  Plan: 1. Focus on lifestyle management. Reduce sodium intake and work toward 15 lbs weight loss in next 1 year. 2. Follow up as needed and in 1 year.        Other   Class 2 obesity due to excess calories with body mass index (BMI) of 39.0 to 39.9 in adult    Pt has high caloric intake.  Desires to improve weight and also requires completion of BMI workplace metric form for [facility].  Pt describes diet highly reliant on convenience foods, meals prepared at restaurants.  Plan: 1. Reduce calorie intake. - Keep food log and reduce portion sizes and high calorie foods. - Reduce meals eaten at restaurants to 2-3 per week.  Prepare lunch from home. -  Eat higher protein breakfast, lean proteins, increase vegetables. 2. Weight loss goal 1/2 -1 lb per week for goal of 15 lbs in 1 year.  3. Follow up 1 year.       Other Visit Diagnoses    Encounter for annual physical exam    -  Primary Physical exam with findings of elevated BP and BMI > 39.0.  Well adult with no additional acute concerns.  Plan: 1. Obtain health maintenance screenings as already collected w/ workplace screening.  Pt requested to bring a copy to clinic for records. 2. Requested pt to obtain eye exam since no routine optometry exam in many years. 3. Return 1 year for annual physical.       Follow up plan: Return in about 1  year (around 02/02/2018) for annual physical.  [name], DNP, AGPCNP-BC Adult Gerontology Primary Care Nurse Practitioner [facility] 02/02/2017, 10:42 AM

## 2017-02-02 NOTE — Assessment & Plan Note (Addendum)
Elevated BP reading today, but BP at goal of < 130/80 at last visit.  Plan: 1. Focus on lifestyle management. Reduce sodium intake and work toward 15 lbs weight loss in next 1 year. 2. Follow up as needed and in 1 year.

## 2017-02-02 NOTE — Assessment & Plan Note (Signed)
Pt has high caloric intake.  Desires to improve weight and also requires completion of BMI workplace metric form for [facility].  Pt describes diet highly reliant on convenience foods, meals prepared at restaurants.  Plan: 1. Reduce calorie intake. - Keep food log and reduce portion sizes and high calorie foods. - Reduce meals eaten at restaurants to 2-3 per week.  Prepare lunch from home. -  Eat higher protein breakfast, lean proteins, increase vegetables. 2. Weight loss goal 1/2 -1 lb per week for goal of 15 lbs in 1 year.  3. Follow up 1 year.

## 2017-10-29 ENCOUNTER — Ambulatory Visit (INDEPENDENT_AMBULATORY_CARE_PROVIDER_SITE_OTHER): Payer: Managed Care, Other (non HMO) | Admitting: Nurse Practitioner

## 2017-10-29 ENCOUNTER — Encounter: Payer: Self-pay | Admitting: Nurse Practitioner

## 2017-10-29 VITALS — BP 148/87 | HR 70 | Temp 98.5°F | Ht 67.0 in | Wt 215.8 lb

## 2017-10-29 DIAGNOSIS — R21 Rash and other nonspecific skin eruption: Secondary | ICD-10-CM

## 2017-10-29 DIAGNOSIS — I10 Essential (primary) hypertension: Secondary | ICD-10-CM

## 2017-10-29 MED ORDER — LISINOPRIL 5 MG PO TABS: 5.0000 mg | ORAL_TABLET | Freq: Every day | ORAL | 3 refills | Status: DC

## 2017-10-29 MED ORDER — PREDNISONE 50 MG PO TABS: 50.0000 mg | ORAL_TABLET | Freq: Every day | ORAL | 0 refills | Status: AC

## 2017-10-29 NOTE — Progress Notes (Signed)
   Subjective:    Patient ID: Jack Lewis, male    DOB: [birth year], 37 y.o.   MRN: [id number]  Jack Lewis is a 37 y.o. male presenting on 10/29/2017 for Rash   HPI Rash Appeared over last 2-9 days and has been spreading for 2-3 days and continuing slowly since. - Had been doing welding.   Weight loss: working hard since February - Keto diet  Social History   Tobacco Use  . Smoking status: Never Smoker  . Smokeless tobacco: Never Used  Substance Use Topics  . Alcohol use: Yes    Alcohol/week: 0.0 oz    Comment: occasional  . Drug use: No    Review of Systems Per HPI unless specifically indicated above     Objective:    BP (!) 148/87 (BP Location: Right Arm, Patient Position: Sitting, Cuff Size: Normal)   Pulse 70   Temp 98.5 F (36.9 C) (Oral)   Ht 5\' 7"  (1.702 m)   Wt 215 lb 12.8 oz (97.9 kg)   BMI 33.80 kg/m   Wt Readings from Last 3 Encounters:  10/29/17 215 lb 12.8 oz (97.9 kg)  02/02/17 249 lb 12.8 oz (113.3 kg)  11/23/16 251 lb 3.2 oz (113.9 kg)    Physical Exam No results found for this or any previous visit.    Assessment & Plan:   Problem List Items Addressed This Visit      Cardiovascular and Mediastinum   HBP (high blood pressure) - Primary   Relevant Medications   lisinopril (PRINIVIL,ZESTRIL) 5 MG tablet    Other Visit Diagnoses    Rash        Likely contact dermatitis vs tinea corporis.  - Treat with prednisone 50 mg daily x 3 days - May use lotrimin cream if not improving with steroid.    # Hypertension Elevated.  Start lisinopril.  Reviewed side effects  Follow-up 1 month.   Meds ordered this encounter  Medications  . predniSONE (DELTASONE) 50 MG tablet    Sig: Take 1 tablet (50 mg total) by mouth daily with breakfast for 3 days.    Dispense:  3 tablet    Refill:  0    Order Specific Question:   Supervising Provider    Answer:   Smitty CordsKARAMALEGOS, ALEXANDER J [[id number]]  . lisinopril (PRINIVIL,ZESTRIL) 5 MG tablet    Sig: Take 1  tablet (5 mg total) by mouth daily.    Dispense:  90 tablet    Refill:  3    Order Specific Question:   Supervising Provider    Answer:   Smitty CordsKARAMALEGOS, ALEXANDER J [[id number]]    Follow up plan: Return in about 1 month (around 11/26/2017) for hypertension AND in 2 weeks for BP check with CMA.  Wilhelmina McardleLauren Jule Whitsel, DNP, AGPCNP-BC Adult Gerontology Primary Care Nurse Practitioner [facility] 10/29/2017, 11:45 AM

## 2017-10-29 NOTE — Patient Instructions (Addendum)
Jack Lewis,   Thank you for coming in to clinic today.  1. START prednisone 50 mg once daily in am with food for 3 days. - You may also use lotrimin cream or spray for your rash if not improved with prednisone.  Apply this twice daily for 7-10 days  2. START lisinopril 5 mg once daily  - Blood pressure goal is less than 130/80  Please schedule a follow-up appointment with Jack Lewis, AGNP. Return in about 1 month (around 11/26/2017) for hypertension AND in 2 weeks for BP check with CMA.  If you have any other questions or concerns, please feel free to call the clinic or send a message through MyChart. You may also schedule an earlier appointment if necessary.  You will receive a survey after today's visit either digitally by e-mail or paper by [employer]. Your experiences and feedback matter to [place].  Please respond so we know how we are doing as we provide care for you.   Jack Mcardle, DNP, AGNP-BC Adult Gerontology Nurse Practitioner [facility], [facility]   Managing Your Hypertension Hypertension is commonly called high blood pressure. This is when the force of your blood pressing against the walls of your arteries is too strong. Arteries are blood vessels that carry blood from your heart throughout your body. Hypertension forces the heart to work harder to pump blood, and may cause the arteries to become narrow or stiff. Having untreated or uncontrolled hypertension can cause heart attack, stroke, kidney disease, and other problems. What are blood pressure readings? A blood pressure reading consists of a higher number over a lower number. Ideally, your blood pressure should be below 120/80. The first ("top") number is called the systolic pressure. It is a measure of the pressure in your arteries as your heart beats. The second ("bottom") number is called the diastolic pressure. It is a measure of the pressure in your arteries as the heart relaxes. What does my blood  pressure reading mean? Blood pressure is classified into four stages. Based on your blood pressure reading, your health care provider may use the following stages to determine what type of treatment you need, if any. Systolic pressure and diastolic pressure are measured in a unit called mm Hg. Normal  Systolic pressure: below 120.  Diastolic pressure: below 80. Elevated  Systolic pressure: 120-129.  Diastolic pressure: below 80. Hypertension stage 1  Systolic pressure: 130-139.  Diastolic pressure: 80-89. Hypertension stage 2  Systolic pressure: 140 or above.  Diastolic pressure: 90 or above. What health risks are associated with hypertension? Managing your hypertension is an important responsibility. Uncontrolled hypertension can lead to:  A heart attack.  A stroke.  A weakened blood vessel (aneurysm).  Heart failure.  Kidney damage.  Eye damage.  Metabolic syndrome.  Memory and concentration problems.  What changes can I make to manage my hypertension? Hypertension can be managed by making lifestyle changes and possibly by taking medicines. Your health care provider will help you make a plan to bring your blood pressure within a normal range. Eating and drinking  Eat a diet that is high in fiber and potassium, and low in salt (sodium), added sugar, and fat. An example eating plan is called the DASH (Dietary Approaches to Stop Hypertension) diet. To eat this way: ? Eat plenty of fresh fruits and vegetables. Try to fill half of your plate at each meal with fruits and vegetables. ? Eat whole grains, such as whole wheat pasta, brown rice,  or whole grain bread. Fill about one quarter of your plate with whole grains. ? Eat low-fat diary products. ? Avoid fatty cuts of meat, processed or cured meats, and poultry with skin. Fill about one quarter of your plate with lean proteins such as fish, chicken without skin, beans, eggs, and tofu. ? Avoid premade and processed foods.  These tend to be higher in sodium, added sugar, and fat.  Reduce your daily sodium intake. Most people with hypertension should eat less than 1,500 mg of sodium a day.  Limit alcohol intake to no more than 1 drink a day for nonpregnant women and 2 drinks a day for men. One drink equals 12 oz of beer, 5 oz of wine, or 1 oz of hard liquor. Lifestyle  Work with your health care provider to maintain a healthy body weight, or to lose weight. Ask what an ideal weight is for you.  Get at least 30 minutes of exercise that causes your heart to beat faster (aerobic exercise) most days of the week. Activities may include walking, swimming, or biking.  Include exercise to strengthen your muscles (resistance exercise), such as weight lifting, as part of your weekly exercise routine. Try to do these types of exercises for 30 minutes at least 3 days a week.  Do not use any products that contain nicotine or tobacco, such as cigarettes and e-cigarettes. If you need help quitting, ask your health care provider.  Control any long-term (chronic) conditions you have, such as high cholesterol or diabetes. Monitoring  Monitor your blood pressure at home as told by your health care provider. Your personal target blood pressure may vary depending on your medical conditions, your age, and other factors.  Have your blood pressure checked regularly, as often as told by your health care provider. Working with your health care provider  Review all the medicines you take with your health care provider because there may be side effects or interactions.  Talk with your health care provider about your diet, exercise habits, and other lifestyle factors that may be contributing to hypertension.  Visit your health care provider regularly. Your health care provider can help you create and adjust your plan for managing hypertension. Will I need medicine to control my blood pressure? Your health care provider may prescribe  medicine if lifestyle changes are not enough to get your blood pressure under control, and if:  Your systolic blood pressure is 130 or higher.  Your diastolic blood pressure is 80 or higher.  Take medicines only as told by your health care provider. Follow the directions carefully. Blood pressure medicines must be taken as prescribed. The medicine does not work as well when you skip doses. Skipping doses also puts you at risk for problems. Contact a health care provider if:  You think you are having a reaction to medicines you have taken.  You have repeated (recurrent) headaches.  You feel dizzy.  You have swelling in your ankles.  You have trouble with your vision. Get help right away if:  You develop a severe headache or confusion.  You have unusual weakness or numbness, or you feel faint.  You have severe pain in your chest or abdomen.  You vomit repeatedly.  You have trouble breathing. Summary  Hypertension is when the force of blood pumping through your arteries is too strong. If this condition is not controlled, it may put you at risk for serious complications.  Your personal target blood pressure may vary depending on your  medical conditions, your age, and other factors. For most people, a normal blood pressure is less than 120/80.  Hypertension is managed by lifestyle changes, medicines, or both. Lifestyle changes include weight loss, eating a healthy, low-sodium diet, exercising more, and limiting alcohol. This information is not intended to replace advice given to you by your health care provider. Make sure you discuss any questions you have with your health care provider. Document Released: 12/30/2011 Document Revised: 03/04/2016 Document Reviewed: 03/04/2016 Elsevier Interactive Patient Education  [employer]2018 Elsevier Inc.

## 2017-11-12 ENCOUNTER — Ambulatory Visit: Payer: Managed Care, Other (non HMO)

## 2017-11-12 VITALS — BP 142/87 | HR 65

## 2017-11-12 DIAGNOSIS — I10 Essential (primary) hypertension: Secondary | ICD-10-CM

## 2017-12-01 ENCOUNTER — Ambulatory Visit: Payer: Managed Care, Other (non HMO) | Admitting: Nurse Practitioner

## 2018-02-17 ENCOUNTER — Encounter: Payer: Self-pay | Admitting: Nurse Practitioner

## 2019-05-15 ENCOUNTER — Other Ambulatory Visit: Payer: Self-pay

## 2019-05-15 ENCOUNTER — Emergency Department
Admission: EM | Admit: 2019-05-15 | Discharge: 2019-05-15 | Disposition: A | Discharge status: Home / Self Care | Payer: Managed Care, Other (non HMO) | Attending: Emergency Medicine | Admitting: Emergency Medicine

## 2019-05-15 DIAGNOSIS — I10 Essential (primary) hypertension: Secondary | ICD-10-CM | POA: Diagnosis present

## 2019-05-15 DIAGNOSIS — Z79899 Other long term (current) drug therapy: Secondary | ICD-10-CM | POA: Diagnosis not present

## 2019-05-15 MED ORDER — LISINOPRIL 5 MG PO TABS: 5.0000 mg | ORAL_TABLET | Freq: Every day | ORAL | 1 refills | Status: DC

## 2019-05-15 NOTE — ED Notes (Signed)
Dr. Cyril Loosen alerted to discharge VS.  Ok to discharge due to discharge plan of bringing BP down slowly.  Patient informed. Understanding verbalized.

## 2019-05-15 NOTE — ED Notes (Signed)
AAOx3.  Skin warm and dry.  NAD 

## 2019-05-15 NOTE — ED Provider Notes (Signed)
[facility] Emergency Department Provider Note   ____________________________________________    I have reviewed the triage vital signs and the nursing notes.   HISTORY  Chief Complaint Hypertension     HPI Jack Lewis is a 39 y.o. male who presents with complaints of high blood pressure.  Patient reports a history of hypertension, stop taking medication sometime ago.  He reports he was at a doctor's appointment with his son and asked the nurse to check his blood pressure, was alarmed when he found it to be elevated.  He feels well he has no chest pain headache nausea vomiting or neuro deficits.  Past Medical History:  Diagnosis Date  . Allergy   . Vertigo     Patient Active Problem List   Diagnosis Date Noted  . Class 2 obesity due to excess calories with body mass index (BMI) of 39.0 to 39.9 in adult 02/02/2017  . HBP (high blood pressure) 07/30/2015    Past Surgical History:  Procedure Laterality Date  . APPENDECTOMY    . VASECTOMY    . VASECTOMY REVERSAL      Prior to Admission medications   Medication Sig Start Date End Date Taking? Authorizing Provider  lisinopril (ZESTRIL) 5 MG tablet Take 1 tablet (5 mg total) by mouth daily. 05/15/19   Lavonia Drafts, MD  loratadine (CLARITIN) 10 MG tablet Take 10 mg by mouth daily.    [provider]     Allergies No known allergies  Family History  Problem Relation Age of Onset  . Hypertension Mother   . Diabetes Father   . Hypertension Father   . Heart attack Paternal Grandfather     Social History Social History   Tobacco Use  . Smoking status: Never Smoker  . Smokeless tobacco: Never Used  Substance Use Topics  . Alcohol use: Yes    Alcohol/week: 0.0 standard drinks    Comment: occasional  . Drug use: No    Review of Systems    Cardiovascular: Denies chest pain. Respiratory: Denies shortness of breath. Gastrointestinal:No nausea, no vomiting.      Neurological: Negative for headaches or weakness   ____________________________________________   PHYSICAL EXAM:  VITAL SIGNS: ED Triage Vitals  Enc Vitals Group     BP 05/15/19 1819 (!) 192/121     Pulse Rate 05/15/19 1819 84     Resp 05/15/19 1819 16     Temp 05/15/19 1819 98.2 F (36.8 C)     Temp Source 05/15/19 1819 Oral     SpO2 05/15/19 1819 98 %     Weight 05/15/19 1716 105.2 kg (232 lb)     Height 05/15/19 1716 1.702 m (5\' 7" )     Head Circumference --      Peak Flow --      Pain Score 05/15/19 1716 6     Pain Loc --      Pain Edu? --      Excl. in [place]? --     Constitutional: Alert and oriented.   Nose: No congestion/rhinnorhea.  Cardiovascular: Normal rate, regular rhythm. Grossly normal heart sounds.  Good peripheral circulation. Respiratory: Normal respiratory effort.  No retractions. Lungs CTAB.  Musculoskeletal:.  Warm and well perfused Neurologic:  Normal speech and language. No gross focal neurologic deficits are appreciated.  Skin:  Skin is warm, dry and intact. No rash noted. Psychiatric: Mood and affect are normal. Speech and behavior are normal.  ____________________________________________   LABS (all labs  ordered are listed, but only abnormal results are displayed)  Labs Reviewed - No data to display ____________________________________________  EKG  None ____________________________________________  RADIOLOGY  None ____________________________________________   PROCEDURES  Procedure(s) performed: No  Procedures   Critical Care performed: No ____________________________________________   INITIAL IMPRESSION / ASSESSMENT AND PLAN / ED COURSE  Pertinent labs & imaging results that were available during my care of the patient were reviewed by me and considered in my medical decision making (see chart for details).  Patient with asymptomatic hypertension, has a history of hypertension has been off his medications.  We will  restart his lisinopril.  He has outpatient PCP follow-up in 2 weeks, recommend documenting blood pressures    ____________________________________________   FINAL CLINICAL IMPRESSION(S) / ED DIAGNOSES  Final diagnoses:  Essential hypertension        Note:  This document was prepared using Dragon voice recognition software and may include unintentional dictation errors.   Jene Every, MD 05/15/19 Nicholos Johns

## 2019-05-15 NOTE — ED Triage Notes (Signed)
Pt comes via POV from home with c/o headaches and HTN. Pt states he has been getting headaches. Pt states he was with his son at PCP and asked RN to check his BP.   Pt states he thought it was high because he was excited. Pt states it was then rechecked and it was still high.  Current BP-194/106.  Pt states hx of HTN and was on medication but was able to come off.  Pt denies any dizziness or blurred vision.

## 2019-05-16 ENCOUNTER — Encounter: Payer: Self-pay | Admitting: Family Medicine

## 2019-05-16 ENCOUNTER — Ambulatory Visit (INDEPENDENT_AMBULATORY_CARE_PROVIDER_SITE_OTHER): Payer: Managed Care, Other (non HMO) | Admitting: Family Medicine

## 2019-05-16 VITALS — BP 140/76 | HR 77 | Temp 97.8°F | Resp 16 | Ht 67.0 in | Wt 239.0 lb

## 2019-05-16 DIAGNOSIS — I10 Essential (primary) hypertension: Secondary | ICD-10-CM | POA: Diagnosis not present

## 2019-05-16 DIAGNOSIS — E669 Obesity, unspecified: Secondary | ICD-10-CM

## 2019-05-16 DIAGNOSIS — Z1152 Encounter for screening for COVID-19: Secondary | ICD-10-CM

## 2019-05-16 NOTE — Progress Notes (Signed)
Subjective:    Patient ID: Jack Lewis, male    DOB: 03/25/1981, 39 y.o.   MRN: [id number]  Jack Lewis is a 39 y.o. male presenting on 05/16/2019 for Hypertension (went to ED yesterday and it was 192/124 mm/hg occ HA and chest pain)   HPI   ED FOLLOW-UP VISIT  Hospital/Location: [facility] Date of ED Visit: 05/15/19  Reason for Presenting to ED: high blood pressure Primary (+Secondary) Diagnosis: Hypertension  FOLLOW-UP - ED provider note and record have been reviewed - Patient presents today about 1 day after recent ED visit. Brief summary of recent course, patient had symptoms of episodes he has had recently with a "heat wave" or "hot flush" symptom that can be episodic or worsening issue worse with some activity, he does work at [employer] and says he has noticed this before. Also one episode during sexual intercourse he had 5-6 min of flushing / warmth and chest discomfort, he does not take any nitrate or sildenafil type medicine, it eventually resolved. - Yesterday afternoon he was at doctors visit with his child, they checked his BP as well and they said his BP was 175/94, then 192/94 on repeat. He was worried about stressors causing this or triggering it. He told his wife and then was advised to go to hospital [facility] ED. They monitored his BP and treated him with Lisinopril 10mg  dose then his BP improve and he was DC'd to home. To follow-up with .  Recent course in past 1-2 years, he was managed by PCP with lifestyle on Keto diet lost weight, but still had higher protein red meat content. He had higher BP. Then he reduced keto diet and focused on lowering red meat and sodium in diet. Approx Winter 2019 he didn't take Lisinopril 5mg  since his BP was normal reading and he had improved with diet. Then he describes interval course with 2020 year he gained weight over the year, and had remained off BP med throughout the year.  - Today reports overall has done well after discharge from  ED. Symptoms of headache frontal has improved but still persistent, mild now. BP has improved today reading here was improved to SBP 140. He now has home BP cuff since last night.  He is taking Lisinopril 5mg  daily again, he says has about 30 day supply from ED He is taking in PM  - He describes associated headaches recently with high blood pressure, frontal bilateral temple region mild to moderate aching pain, worse last night, now with improved BP it is improved. Occasionally he wakes up with headache at times.  - New medications on discharge: Lisinopril 5mg  daily   I have reviewed the discharge medication list, and have reconciled the current and discharge medications today.   Health Maintenance: Due for Flu Shot, declines today despite counseling on benefits   Depression screen [facility] 2/9 05/16/2019 11/23/2016 09/24/2015  Decreased Interest 0 0 0  Down, Depressed, Hopeless 0 0 0  PHQ - 2 Score 0 0 0    Social History   Tobacco Use  . Smoking status: Never Smoker  . Smokeless tobacco: Never Used  Substance Use Topics  . Alcohol use: Yes    Alcohol/week: 0.0 standard drinks    Comment: occasional  . Drug use: No    Review of Systems  Constitutional: Negative for activity change, appetite change, chills, diaphoresis, fatigue and fever.  HENT: Negative for congestion and hearing loss.   Eyes: Negative for visual disturbance.  Respiratory: Negative for cough, chest tightness, shortness of breath and wheezing.   Cardiovascular: Negative for chest pain, palpitations and leg swelling.  Gastrointestinal: Negative for abdominal pain, anal bleeding, blood in stool, constipation, diarrhea, nausea and vomiting.  Endocrine: Negative for cold intolerance.  Genitourinary: Negative for decreased urine volume, dysuria, frequency, hematuria, testicular pain and urgency.  Musculoskeletal: Negative for arthralgias, back pain and neck pain.  Skin: Negative for rash.  Allergic/Immunologic: Negative  for environmental allergies.  Neurological: Positive for headaches. Negative for dizziness, weakness, light-headedness and numbness.  Hematological: Negative for adenopathy.  Psychiatric/Behavioral: Negative for behavioral problems, dysphoric mood and sleep disturbance. The patient is not nervous/anxious.    Per HPI unless specifically indicated above     Objective:    BP 140/76 (BP Location: Right Arm, Patient Position: Sitting, Cuff Size: Large) Comment: manual  Pulse 77   Temp 97.8 F (36.6 C) (Oral)   Resp 16   Ht 5\' 7"  (1.702 m)   Wt 239 lb (108.4 kg)   BMI 37.43 kg/m   Wt Readings from Last 3 Encounters:  05/16/19 239 lb (108.4 kg)  05/15/19 232 lb (105.2 kg)  10/29/17 215 lb 12.8 oz (97.9 kg)    Physical Exam Vitals and nursing note reviewed.  Constitutional:      General: He is not in acute distress.    Appearance: He is well-developed. He is not diaphoretic.     Comments: Well-appearing, comfortable, cooperative  HENT:     Head: Normocephalic and atraumatic.  Eyes:     General:        Right eye: No discharge.        Left eye: No discharge.     Conjunctiva/sclera: Conjunctivae normal.  Neck:     Thyroid: No thyromegaly.     Vascular: No carotid bruit.  Cardiovascular:     Rate and Rhythm: Normal rate and regular rhythm.     Heart sounds: Normal heart sounds. No murmur.  Pulmonary:     Effort: Pulmonary effort is normal. No respiratory distress.     Breath sounds: Normal breath sounds. No wheezing or rales.  Musculoskeletal:        General: Normal range of motion.     Cervical back: Normal range of motion and neck supple.  Lymphadenopathy:     Cervical: No cervical adenopathy.  Skin:    General: Skin is warm and dry.     Findings: No erythema or rash.  Neurological:     Mental Status: He is alert and oriented to person, place, and time.  Psychiatric:        Behavior: Behavior normal.     Comments: Well groomed, good eye contact, normal speech and  thoughts        No results found for this or any previous visit.    Assessment & Plan:   Problem List Items Addressed This Visit    Obesity (BMI 30-39.9)   Essential hypertension - Primary   Relevant Orders   Comprehensive metabolic panel   CBC with Differential/Platelet    Other Visit Diagnoses    Encounter for screening for COVID-19       Relevant Orders   SAR CoV2 Serology (COVID 19)AB(IGG)IA      #HTN, uncontrolled - improved back on lisinopril Associated frontal headache - improving  ED visit reviewed, had high BP 190/120, multiple high readings earlier that day as well No labs done or other testing in hospital, since he had been off medicine for while  and some poor lifestyle changes and weight gain they asked him to follow up with PCP after resuming medication  Labs ordered today for [facility].  Discussion today that his BP and headache seem improved < 24 hours back on Lisinopril 5mg  low dose, advised him he can continue Lisinopril 5mg  daily for next 1 week. He can check BP at home regularly 1-2 x day for now and record readings.   If still at goal BP < 140/90 over this week on average and headache still improved, he can call/message [place] in 1 week to notify us and we can re order Lisinopril 5mg  daily for him to continue.  If not at goal BP, and he has >140/90 and/or still headache - we can likely advise him to increase Lisinopril 5mg  to x 2 pills for 10mg  daily - try that first then notify us when to order new rx Lisinopril 10mg   Encourage lifestyle improvement resume with low sodium diet, heart healthy, weight loss, exercise to also help his BP control.  If any new concerns or significant symptoms return can seek care sooner again at hospital ED or f/u with [place] and future may consider Cardiology referral if he has any concerning associated features with his HTN again.   No orders of the defined types were placed in this encounter.    Follow up plan: Return in about 4  weeks (around 06/13/2019), or if symptoms worsen or fail to improve, for HTN vs Physical.  Future labs ordered for tomorrow [facility] -CMET CBC COVID IgG Antibody requested by patient (asymptomatic but prior exposure weeks to months ago he says to check), printed released given to patient.  Nobie Putnam, [facility] 05/16/2019, 11:37 AM

## 2019-05-16 NOTE — Patient Instructions (Addendum)
Thank you for coming to the office today.  Continue Lisinopril 5mg  daily (can take in evening if you prefer), keep track of BP, goal is < 140/90, today was much improved, but still 140 on top number. Give it a full week of monitoring BP to determine what to do next.  - If overall improved BP, and headaches improve - then continue Lisinopril 5mg  daily - can message or call me to request for refill 90 days on this dose.  If BP still elevated or headaches still continuing - can call or message and we can advise you to double the dose - for Lisinopril 5 mg x 2 = 10mg  daily for a trial - if this works we can order that dose.  Labs for [facility] for now, Chemistry Blood Count and COVID antibody  We can plan on annual physical in 4-6 weeks for routine panel cholesterol sugar etc and more wellness focus in future.  Non fasting lab now tomorrow  In 4-6 weeks likely can do [facility] labs again DUE for FASTING BLOOD WORK (no food or drink after midnight before the lab appointment, only water or coffee without cream/sugar on the morning of)   - Make sure Lab Only appointment is at about 1 week before your next appointment, so that results will be available  For Lab Results, once available within 2-3 days of blood draw, you can can log in to MyChart online to view your results and a brief explanation. Also, we can discuss results at next follow-up visit.   Please schedule a Follow-up Appointment to: Return in about 4 weeks (around 06/13/2019), or if symptoms worsen or fail to improve, for HTN vs Physical.  If you have any other questions or concerns, please feel free to call the office or send a message through MyChart. You may also schedule an earlier appointment if necessary.  Additionally, you may be receiving a survey about your experience at our office within a few days to 1 week by e-mail or mail. We value your feedback.  , DO [facility], 06/15/2019

## 2019-05-20 LAB — CBC WITH DIFFERENTIAL/PLATELET
Basophils Absolute: 0.1 10*3/uL (ref 0.0–0.2)
Basos: 1 %
EOS (ABSOLUTE): 0.2 10*3/uL (ref 0.0–0.4)
Eos: 3 %
Hematocrit: 44.9 % (ref 37.5–51.0)
Hemoglobin: 15.1 g/dL (ref 13.0–17.7)
Immature Grans (Abs): 0 10*3/uL (ref 0.0–0.1)
Immature Granulocytes: 0 %
Lymphocytes Absolute: 2.4 10*3/uL (ref 0.7–3.1)
Lymphs: 29 %
MCH: 30 pg (ref 26.6–33.0)
MCHC: 33.6 g/dL (ref 31.5–35.7)
MCV: 89 fL (ref 79–97)
Monocytes Absolute: 0.5 10*3/uL (ref 0.1–0.9)
Monocytes: 6 %
Neutrophils Absolute: 5.2 10*3/uL (ref 1.4–7.0)
Neutrophils: 61 %
Platelets: 236 10*3/uL (ref 150–450)
RBC: 5.03 x10E6/uL (ref 4.14–5.80)
RDW: 13.1 % (ref 11.6–15.4)
WBC: 8.3 10*3/uL (ref 3.4–10.8)

## 2019-05-20 LAB — COMPREHENSIVE METABOLIC PANEL
ALT: 54 IU/L — ABNORMAL HIGH (ref 0–44)
AST: 38 IU/L (ref 0–40)
Albumin/Globulin Ratio: 2 (ref 1.2–2.2)
Albumin: 4.9 g/dL (ref 4.0–5.0)
Alkaline Phosphatase: 77 IU/L (ref 39–117)
BUN/Creatinine Ratio: 11 (ref 9–20)
BUN: 11 mg/dL (ref 6–20)
Bilirubin Total: 0.4 mg/dL (ref 0.0–1.2)
CO2: 25 mmol/L (ref 20–29)
Calcium: 9.9 mg/dL (ref 8.7–10.2)
Chloride: 103 mmol/L (ref 96–106)
Creatinine, Ser: 1 mg/dL (ref 0.76–1.27)
GFR calc Af Amer: 111 mL/min/{1.73_m2} (ref >59)
GFR calc non Af Amer: 96 mL/min/{1.73_m2} (ref >59)
Globulin, Total: 2.5 g/dL (ref 1.5–4.5)
Glucose: 93 mg/dL (ref 65–99)
Potassium: 4.8 mmol/L (ref 3.5–5.2)
Sodium: 140 mmol/L (ref 134–144)
Total Protein: 7.4 g/dL (ref 6.0–8.5)

## 2019-05-20 LAB — SAR COV2 SEROLOGY (COVID19)AB(IGG),IA: DiaSorin SARS-CoV-2 Ab, IgG: NEGATIVE

## 2019-05-22 ENCOUNTER — Ambulatory Visit (INDEPENDENT_AMBULATORY_CARE_PROVIDER_SITE_OTHER): Payer: Managed Care, Other (non HMO) | Admitting: Family

## 2019-05-22 ENCOUNTER — Encounter: Payer: Self-pay | Admitting: Family

## 2019-05-22 VITALS — BP 151/94

## 2019-05-22 DIAGNOSIS — I10 Essential (primary) hypertension: Secondary | ICD-10-CM

## 2019-05-22 MED ORDER — AMLODIPINE BESYLATE 2.5 MG PO TABS: 2.5000 mg | ORAL_TABLET | Freq: Every day | ORAL | 3 refills | Status: DC

## 2019-05-22 NOTE — Assessment & Plan Note (Signed)
Certainly improving over time which I am reassured to see.  Not less than 120/80 which we discussed as goal today.  We will add on small dose of amlodipine.  Patient will return for BMP labs on Friday which will be 7 days after increase of lisinopril from 5 mg to 10 mg. Discussed at great length the episode of "chest tightness" which he had during intercourse.  Has had no recurrence of this.  We also discussed deconditioning as perhaps contributory.   Unfortunately EKG was not done when he was in the emergency room.  Patient is coming tomorrow morning for an EKG.  I have emphasized that if he would have any chest tightness, chest pain, recurrence of shortness of breath, he would need to go directly to emergency room.  He verbalized understanding of this.

## 2019-05-22 NOTE — Progress Notes (Signed)
Virtual Visit via Video Note  I connected with@  on 05/22/19 at  2:00 PM EST by a video enabled telemedicine application and verified that I am speaking with the correct person using two identifiers.  Location patient: home Location provider:work Persons participating in the virtual visit: patient, provider  I discussed the limitations of evaluation and management by telemedicine and the availability of in person appointments. The patient expressed understanding and agreed to proceed.  Interactive audio and video telecommunications were attempted between this provider and patient, I was able to see him for several minutes. However we lost connection at that point , We continued and completed visit with audio only.     HPI: Had been going to [facility], and prior PCP retired.   Primary concern is blood pressure, in the mornings, usually around 135/85. In the afternoons , averages 150/100. On lisinopril 5mg   One week ago, then increased to 10mg  qhs 5 days ago.   Headaches and flush feeling.  One episode during intercourse,  when felt chest tightness for 5 minutes.  Felt winded at that time. Resolved on its own. No crushing chest pain. NO recurrence of episode.  No  numbness or tingling radiating to left arm or jaw, palpitations, dizziness,changes in vision during episode.  BP been 192/121 prior to going to ED.   ED 05/15/19 . No EKG done.   Prior PCP bp 140/76.   ROS: See pertinent positives and negatives per HPI.  Past Medical History:  Diagnosis Date  . Allergy   . Hypertension   . Vertigo     Past Surgical History:  Procedure Laterality Date  . APPENDECTOMY    . VASECTOMY    . VASECTOMY REVERSAL      Family History  Problem Relation Age of Onset  . Hypertension Mother   . Diabetes Father   . Hypertension Father   . Heart attack Paternal Grandfather 20    SOCIAL HX: never smoker   Current Outpatient Medications:  .  lisinopril (ZESTRIL) 5 MG  tablet, Take 1 tablet (5 mg total) by mouth daily. (Patient taking differently: Take 10 mg by mouth daily. ), Disp: 90 tablet, Rfl: 1 .  loratadine (CLARITIN) 10 MG tablet, Take 10 mg by mouth daily., Disp: , Rfl:  .  amLODipine (NORVASC) 2.5 MG tablet, Take 1 tablet (2.5 mg total) by mouth daily., Disp: 90 tablet, Rfl: 3  EXAM:  VITALS per patient if applicable:  GENERAL: alert, oriented, appears well and in no acute distress  HEENT: atraumatic, conjunttiva clear, no obvious abnormalities on inspection of external nose and ears  NECK: normal movements of the head and neck  LUNGS: on inspection no signs of respiratory distress, breathing rate appears normal, no obvious gross SOB, gasping or wheezing  CV: no obvious cyanosis  MS: moves all visible extremities without noticeable abnormality  PSYCH/NEURO: pleasant and cooperative, no obvious depression or anxiety, speech and thought processing grossly intact  ASSESSMENT AND PLAN:  Discussed the following assessment and plan:  Essential hypertension - Plan: Basic metabolic panel, amLODipine (NORVASC) 2.5 MG tablet, EKG 12-Lead Problem List Items Addressed This Visit      Cardiovascular and Mediastinum   Essential hypertension - Primary    Certainly improving over time which I am reassured to see.  Not less than 120/80 which we discussed as goal today.  We will add on small dose of amlodipine.  Patient will return for BMP labs on Friday which will be 7 days after  increase of lisinopril from 5 mg to 10 mg. Discussed at great length the episode of "chest tightness" which he had during intercourse.  Has had no recurrence of this.  We also discussed deconditioning as perhaps contributory.   Unfortunately EKG was not done when he was in the emergency room.  Patient is coming tomorrow morning for an EKG.  I have emphasized that if he would have any chest tightness, chest pain, recurrence of shortness of breath, he would need to go directly to  emergency room.  He verbalized understanding of this.      Relevant Medications   amLODipine (NORVASC) 2.5 MG tablet   Other Relevant Orders   Basic metabolic panel   EKG [id number]      -we discussed possible serious and likely etiologies, options for evaluation and workup, limitations of telemedicine visit vs in person visit, treatment, treatment risks and precautions. Pt prefers to treat via telemedicine empirically rather then risking or undertaking an in person visit at this moment. Patient agrees to seek prompt in person care if worsening, new symptoms arise, or if is not improving with treatment.   I discussed the assessment and treatment plan with the patient. The patient was provided an opportunity to ask questions and all were answered. The patient agreed with the plan and demonstrated an understanding of the instructions.   The patient was advised to call back or seek an in-person evaluation if the symptoms worsen or if the condition fails to improve as anticipated.   [name], FNP  I spent 25 min non face to face w/ pt.

## 2019-05-23 ENCOUNTER — Other Ambulatory Visit: Payer: Self-pay

## 2019-05-23 ENCOUNTER — Ambulatory Visit: Payer: Managed Care, Other (non HMO) | Admitting: Family

## 2019-05-23 ENCOUNTER — Ambulatory Visit: Payer: Managed Care, Other (non HMO)

## 2019-05-23 ENCOUNTER — Encounter: Payer: Self-pay | Admitting: Family

## 2019-05-23 DIAGNOSIS — I10 Essential (primary) hypertension: Secondary | ICD-10-CM | POA: Diagnosis not present

## 2019-05-23 NOTE — Progress Notes (Signed)
EKG will be scanned into chart & reviewed by PCP.

## 2019-05-23 NOTE — Progress Notes (Signed)
Patient had virtual visit with me yesterday.  He is here today for an EKG.  He feels well.  He is well appearing and in no distress today.  No further episodes. he has started amlodipine today.  Denies exertional chest pain or pressure, numbness or tingling radiating to left arm or jaw, palpitations, dizziness, frequent headaches, changes in vision, or shortness of breath.   EKG shows normal sinus rhythm.  When compared to prior EKG in 2008, no significant changes and improved( RBBB noted in 2008).   Advised as I did yesterday that he returns to ED for ANY recurrence of chest pain.  He verbalized understanding of all

## 2019-05-23 NOTE — Assessment & Plan Note (Signed)
Well appearing. No further episodes of chest tightness.  EKG shows normal sinus rhythm.  When compared to prior EKG in 2008, no significant changes and improved( RBBB noted in 2008).   Advised as I did yesterday that he returns to ED for ANY recurrence of chest pain.  He verbalized understanding of all

## 2019-05-25 ENCOUNTER — Other Ambulatory Visit: Payer: Managed Care, Other (non HMO)

## 2019-05-26 ENCOUNTER — Encounter: Payer: Self-pay | Admitting: Family

## 2019-05-26 ENCOUNTER — Telehealth: Payer: Self-pay | Admitting: Family

## 2019-05-26 NOTE — Telephone Encounter (Signed)
Call pt I just wanted to check on him and how he is feeling.  How is blood pressure? I reviewing his chart, I notice that he does have family h/o CVD ( PGF had heart attack) I wanted to offer that if he would like to see cardiologist for assessment of his cardiac risk including imaging or stress test to stratify his own individual risk for cardiac event, I am certainly happy to place that referral.  We do this quite often. We can do a chest xray in our office which is not cardiac work up but it can be a rudimentary screen for severe , acute lung or heart diease.   Continue to stay vigilant for any recurrent episodes of chest tightness, shortness of breath.   Let me know his thoughts

## 2019-05-26 NOTE — Telephone Encounter (Signed)
I tried to call patient twice but was unable to reach. VM was full & unable to leave message.

## 2019-05-30 NOTE — Telephone Encounter (Signed)
LMTCB

## 2019-05-30 NOTE — Telephone Encounter (Signed)
Shanda Bumps,  Would you circle back with below message?

## 2019-05-31 ENCOUNTER — Other Ambulatory Visit: Payer: Self-pay | Admitting: Family

## 2019-05-31 ENCOUNTER — Encounter: Payer: Self-pay | Admitting: Family

## 2019-05-31 DIAGNOSIS — R899 Unspecified abnormal finding in specimens from other organs, systems and tissues: Secondary | ICD-10-CM

## 2019-06-09 NOTE — Progress Notes (Deleted)
   Subjective:    Patient ID: Jack Lewis, male    DOB: [birth year], 39 y.o.   MRN: [id number]  CC: Jack Lewis is a 39 y.o. male who presents today for follow up.   HPI: Card referral?   HISTORY:  Past Medical History:  Diagnosis Date  . Allergy   . Hypertension   . Vertigo    Past Surgical History:  Procedure Laterality Date  . APPENDECTOMY    . VASECTOMY    . VASECTOMY REVERSAL     Family History  Problem Relation Age of Onset  . Hypertension Mother   . Diabetes Father   . Hypertension Father   . Heart attack Paternal Grandfather 61    Allergies: No known allergies Current Outpatient Medications on File Prior to Visit  Medication Sig Dispense Refill  . amLODipine (NORVASC) 2.5 MG tablet Take 1 tablet (2.5 mg total) by mouth daily. 90 tablet 3  . lisinopril (ZESTRIL) 5 MG tablet Take 1 tablet (5 mg total) by mouth daily. (Patient taking differently: Take 10 mg by mouth daily. ) 90 tablet 1  . loratadine (CLARITIN) 10 MG tablet Take 10 mg by mouth daily.     No current facility-administered medications on file prior to visit.    Social History   Tobacco Use  . Smoking status: Never Smoker  . Smokeless tobacco: Never Used  Substance Use Topics  . Alcohol use: Yes    Alcohol/week: 0.0 standard drinks    Comment: occasional  . Drug use: No    Review of Systems    Objective:    There were no vitals taken for this visit. BP Readings from Last 3 Encounters:  05/23/19 (!) 152/90  05/22/19 (!) 151/94  05/16/19 140/76   Wt Readings from Last 3 Encounters:  05/23/19 236 lb 9.6 oz (107.3 kg)  05/16/19 239 lb (108.4 kg)  05/15/19 232 lb (105.2 kg)    Physical Exam     Assessment & Plan:   Problem List Items Addressed This Visit    None       I am having [name] maintain his loratadine, lisinopril, and amLODipine.   No orders of the defined types were placed in this encounter.   Return precautions given.   Risks, benefits, and  alternatives of the medications and treatment plan prescribed today were discussed, and patient expressed understanding.   Education regarding symptom management and diagnosis given to patient on AVS.  Continue to follow with [name], FNP for routine health maintenance.   [name] and I agreed with plan.   [name], FNP

## 2019-06-14 ENCOUNTER — Other Ambulatory Visit: Payer: Self-pay

## 2019-06-14 ENCOUNTER — Other Ambulatory Visit (INDEPENDENT_AMBULATORY_CARE_PROVIDER_SITE_OTHER): Payer: Managed Care, Other (non HMO)

## 2019-06-14 DIAGNOSIS — R899 Unspecified abnormal finding in specimens from other organs, systems and tissues: Secondary | ICD-10-CM

## 2019-06-14 NOTE — Addendum Note (Signed)
Addended by: Warden Fillers on: 06/14/2019 02:26 PM   Modules accepted: Orders

## 2019-06-15 LAB — COMPREHENSIVE METABOLIC PANEL
ALT: 21 IU/L (ref 0–44)
AST: 20 IU/L (ref 0–40)
Albumin/Globulin Ratio: 1.8 (ref 1.2–2.2)
Albumin: 4.7 g/dL (ref 4.0–5.0)
Alkaline Phosphatase: 75 IU/L (ref 39–117)
BUN/Creatinine Ratio: 12 (ref 9–20)
BUN: 14 mg/dL (ref 6–20)
Bilirubin Total: 0.5 mg/dL (ref 0.0–1.2)
CO2: 19 mmol/L — ABNORMAL LOW (ref 20–29)
Calcium: 10.1 mg/dL (ref 8.7–10.2)
Chloride: 102 mmol/L (ref 96–106)
Creatinine, Ser: 1.2 mg/dL (ref 0.76–1.27)
GFR calc Af Amer: 89 mL/min/{1.73_m2} (ref >59)
GFR calc non Af Amer: 77 mL/min/{1.73_m2} (ref >59)
Globulin, Total: 2.6 g/dL (ref 1.5–4.5)
Glucose: 89 mg/dL (ref 65–99)
Potassium: 4.5 mmol/L (ref 3.5–5.2)
Sodium: 140 mmol/L (ref 134–144)
Total Protein: 7.3 g/dL (ref 6.0–8.5)

## 2019-06-16 ENCOUNTER — Ambulatory Visit: Payer: Managed Care, Other (non HMO) | Admitting: Family

## 2019-06-18 ENCOUNTER — Telehealth: Payer: Self-pay | Admitting: Family

## 2019-06-18 ENCOUNTER — Encounter: Payer: Self-pay | Admitting: Family

## 2019-06-18 NOTE — Telephone Encounter (Signed)
Call patient Electrolytes are normal.  Please confirm what dose of lisinopril he is in on.  I think it 10 mg.  Please ensure medication list is accurate.   Also, we were not able to reach him with the below message:   I just wanted to check on him and how he is feeling.  How is blood pressure? I reviewing his chart, I notice that he does have family h/o CVD ( PGF had heart attack) I wanted to offer that if he would like to see cardiologist for assessment of his cardiac risk including imaging or stress test to stratify his own individual risk for cardiac event, I am certainly happy to place that referral.  We do this quite often. We can do a chest xray in our office which is not cardiac work up but it can be a rudimentary screen for severe , acute lung or heart diease.   Continue to stay vigilant for any recurrent episodes of chest tightness, shortness of breath.   Let me know his thoughts

## 2019-06-19 ENCOUNTER — Other Ambulatory Visit: Payer: Self-pay

## 2019-06-19 ENCOUNTER — Other Ambulatory Visit: Payer: Self-pay | Admitting: Family

## 2019-06-19 DIAGNOSIS — I10 Essential (primary) hypertension: Secondary | ICD-10-CM

## 2019-06-19 MED ORDER — AMLODIPINE BESYLATE 2.5 MG PO TABS: 2.5000 mg | ORAL_TABLET | Freq: Every day | ORAL | 3 refills | Status: DC

## 2019-06-19 MED ORDER — LISINOPRIL 10 MG PO TABS: 10.0000 mg | ORAL_TABLET | Freq: Every day | ORAL | 3 refills | Status: DC

## 2019-06-19 NOTE — Telephone Encounter (Signed)
This has been addressed via my chart messages.

## 2019-07-04 NOTE — Progress Notes (Deleted)
   Subjective:    Patient ID: Jack Lewis, male    DOB: 09-07-1980, 39 y.o.   MRN: [id number]  CC: Jack Lewis is a 39 y.o. male who presents today for follow up.   HPI: Cardiology appt?  CXR ??     HISTORY:  Past Medical History:  Diagnosis Date  . Allergy   . Hypertension   . Vertigo    Past Surgical History:  Procedure Laterality Date  . APPENDECTOMY    . VASECTOMY    . VASECTOMY REVERSAL     Family History  Problem Relation Age of Onset  . Hypertension Mother   . Diabetes Father   . Hypertension Father   . Heart attack Paternal Grandfather 35    Allergies: No known allergies Current Outpatient Medications on File Prior to Visit  Medication Sig Dispense Refill  . amLODipine (NORVASC) 2.5 MG tablet Take 1 tablet (2.5 mg total) by mouth daily. 90 tablet 3  . lisinopril (ZESTRIL) 10 MG tablet Take 1 tablet (10 mg total) by mouth daily. 90 tablet 3  . loratadine (CLARITIN) 10 MG tablet Take 10 mg by mouth daily.     No current facility-administered medications on file prior to visit.    Social History   Tobacco Use  . Smoking status: Never Smoker  . Smokeless tobacco: Never Used  Substance Use Topics  . Alcohol use: Yes    Alcohol/week: 0.0 standard drinks    Comment: occasional  . Drug use: No    Review of Systems    Objective:    There were no vitals taken for this visit. BP Readings from Last 3 Encounters:  05/23/19 (!) 152/90  05/22/19 (!) 151/94  05/16/19 140/76   Wt Readings from Last 3 Encounters:  05/23/19 236 lb 9.6 oz (107.3 kg)  05/16/19 239 lb (108.4 kg)  05/15/19 232 lb (105.2 kg)    Physical Exam     Assessment & Plan:   Problem List Items Addressed This Visit    None       I am having Jack Lewis maintain his loratadine, lisinopril, and amLODipine.   No orders of the defined types were placed in this encounter.   Return precautions given.   Risks, benefits, and alternatives of the medications and  treatment plan prescribed today were discussed, and patient expressed understanding.   Education regarding symptom management and diagnosis given to patient on AVS.  Continue to follow with Jack Grana, FNP for routine health maintenance.   Jack Lewis and I agreed with plan.   [name], FNP

## 2019-07-10 ENCOUNTER — Ambulatory Visit: Payer: Managed Care, Other (non HMO) | Admitting: Family

## 2019-07-31 ENCOUNTER — Ambulatory Visit: Payer: Managed Care, Other (non HMO) | Admitting: Family

## 2019-07-31 ENCOUNTER — Other Ambulatory Visit: Payer: Self-pay

## 2019-07-31 ENCOUNTER — Encounter: Payer: Self-pay | Admitting: Family

## 2019-07-31 VITALS — BP 104/73 | HR 81 | Temp 97.2°F | Ht 67.0 in | Wt 213.4 lb

## 2019-07-31 DIAGNOSIS — Z136 Encounter for screening for cardiovascular disorders: Secondary | ICD-10-CM

## 2019-07-31 DIAGNOSIS — I10 Essential (primary) hypertension: Secondary | ICD-10-CM | POA: Diagnosis not present

## 2019-07-31 NOTE — Progress Notes (Signed)
Subjective:    Patient ID: Jack Lewis, male    DOB: Mar 30, 1981, 39 y.o.   MRN: [id number]  CC: Jack Lewis is a 39 y.o. male who presents today for follow up.   HPI: Feels well  . No concerns at this time.   Intentional weight loss, 25 lbs, with low carb, sugar diet. In the gym, 'every chance he gets.' No CP with activity.,  He also denies numbness or tingling radiating to left arm or jaw, palpitations, dizziness, frequent headaches, changes in vision, or shortness of breath.   HTN- compliant with medication. Feels well on regimen. Since weight loss, blood pressure has come down.   Father has DM.     Declined referral to cardiology at this time; he has number to call them.   HISTORY:  Past Medical History:  Diagnosis Date  . Allergy   . Hypertension   . Vertigo    Past Surgical History:  Procedure Laterality Date  . APPENDECTOMY    . VASECTOMY    . VASECTOMY REVERSAL     Family History  Problem Relation Age of Onset  . Hypertension Mother   . Diabetes Father   . Hypertension Father   . Heart attack Paternal Grandfather 74    Allergies: No known allergies Current Outpatient Medications on File Prior to Visit  Medication Sig Dispense Refill  . amLODipine (NORVASC) 2.5 MG tablet Take 1 tablet (2.5 mg total) by mouth daily. 90 tablet 3  . cetirizine (ZYRTEC) 5 MG tablet Take 5 mg by mouth daily. Take one tablet daily at bedtime.    Marland Kitchen lisinopril (ZESTRIL) 10 MG tablet Take 1 tablet (10 mg total) by mouth daily. 90 tablet 3  . loratadine (CLARITIN) 10 MG tablet Take 10 mg by mouth daily.     No current facility-administered medications on file prior to visit.    Social History   Tobacco Use  . Smoking status: Never Smoker  . Smokeless tobacco: Never Used  Substance Use Topics  . Alcohol use: Yes    Alcohol/week: 0.0 standard drinks    Comment: occasional  . Drug use: No    Review of Systems  Constitutional: Negative for chills and fever.  HENT:  Negative for congestion, ear pain, rhinorrhea, sinus pressure and sore throat.   Respiratory: Negative for cough, shortness of breath and wheezing.   Cardiovascular: Negative for chest pain, palpitations and leg swelling.  Gastrointestinal: Negative for diarrhea, nausea and vomiting.  Genitourinary: Negative for dysuria.  Musculoskeletal: Negative for myalgias.  Skin: Negative for rash.  Neurological: Negative for headaches.  Hematological: Negative for adenopathy.      Objective:    BP 104/73 (BP Location: Left Arm, Patient Position: Sitting, Cuff Size: Large)   Pulse 81   Temp (!) 97.2 F (36.2 C) (Temporal)   Ht 5\' 7"  (1.702 m)   Wt 213 lb 6.4 oz (96.8 kg)   SpO2 97%   BMI 33.42 kg/m  BP Readings from Last 3 Encounters:  07/31/19 104/73  05/23/19 (!) 152/90  05/22/19 (!) 151/94   Wt Readings from Last 3 Encounters:  07/31/19 213 lb 6.4 oz (96.8 kg)  05/23/19 236 lb 9.6 oz (107.3 kg)  05/16/19 239 lb (108.4 kg)    Physical Exam Vitals reviewed.  Constitutional:      Appearance: He is well-developed.  Cardiovascular:     Rate and Rhythm: Regular rhythm.     Heart sounds: Normal heart sounds.  Pulmonary:  Effort: Pulmonary effort is normal. No respiratory distress.     Breath sounds: Normal breath sounds. No wheezing, rhonchi or rales.  Skin:    General: Skin is warm and dry.  Neurological:     Mental Status: He is alert.  Psychiatric:        Speech: Speech normal.        Behavior: Behavior normal.        Assessment & Plan:   Problem List Items Addressed This Visit      Cardiovascular and Mediastinum   Hypertension - Primary    Improved, in particular with weight loss.  Counseled on continued vigilance in regards to blood pressure and weight loss. He understands  to call if he would become symptomatic as I would recommend to de-escalate regimen and stop amlodipine if needed.  Continue regimen for now      Relevant Orders   Hemoglobin A1c   Lipid  panel    Other Visit Diagnoses    Screening for cardiovascular condition       Relevant Orders   Hemoglobin A1c   Lipid panel       I am having Jack Lewis maintain his loratadine, lisinopril, amLODipine, and cetirizine.   No orders of the defined types were placed in this encounter.   Return precautions given.   Risks, benefits, and alternatives of the medications and treatment plan prescribed today were discussed, and patient expressed understanding.   Education regarding symptom management and diagnosis given to patient on AVS.  Continue to follow with [name], FNP for routine health maintenance.   Jack Lewis and I agreed with plan.   [name], FNP

## 2019-07-31 NOTE — Patient Instructions (Signed)
Nice to see you As discussed, if you continue to lose weight, please be very vigilant in regards blood pressure.  Certainly if you feel dizzy or lightheaded, we would need to de-escalate regimen and stop amlodipine.  Please call me with any concerns and certainly it may be prudent to bring you off amlodipine in time to see if blood pressure remains well controlled

## 2019-07-31 NOTE — Assessment & Plan Note (Signed)
Improved, in particular with weight loss.  Counseled on continued vigilance in regards to blood pressure and weight loss. He understands  to call if he would become symptomatic as I would recommend to de-escalate regimen and stop amlodipine if needed.  Continue regimen for now

## 2020-01-30 ENCOUNTER — Ambulatory Visit: Payer: Managed Care, Other (non HMO) | Admitting: Family

## 2020-02-02 ENCOUNTER — Other Ambulatory Visit: Payer: Self-pay

## 2020-02-02 ENCOUNTER — Encounter: Payer: Self-pay | Admitting: Family

## 2020-02-02 ENCOUNTER — Ambulatory Visit: Payer: Managed Care, Other (non HMO) | Admitting: Family

## 2020-02-02 VITALS — BP 104/78 | HR 68 | Temp 98.5°F | Ht 67.0 in | Wt 223.6 lb

## 2020-02-02 DIAGNOSIS — E669 Obesity, unspecified: Secondary | ICD-10-CM

## 2020-02-02 DIAGNOSIS — Z1159 Encounter for screening for other viral diseases: Secondary | ICD-10-CM

## 2020-02-02 DIAGNOSIS — G4700 Insomnia, unspecified: Secondary | ICD-10-CM | POA: Diagnosis not present

## 2020-02-02 DIAGNOSIS — I10 Essential (primary) hypertension: Secondary | ICD-10-CM | POA: Diagnosis not present

## 2020-02-02 MED ORDER — TRAZODONE HCL 50 MG PO TABS: 25.0000 mg | ORAL_TABLET | Freq: Every evening | ORAL | 3 refills | Status: DC | PRN

## 2020-02-02 MED ORDER — LISINOPRIL 5 MG PO TABS: 5.0000 mg | ORAL_TABLET | Freq: Every day | ORAL | 1 refills | Status: DC

## 2020-02-02 NOTE — Assessment & Plan Note (Signed)
Discussed following low carb diet with attention to not eating unhealthy fats ( bacon, sausage) in setting of his family cardiac history. Advised that I didn't feel his BMI was entirely accurate as he is very muscular build. Advised referral to [place] [facility], he will think about this in the future.

## 2020-02-02 NOTE — Assessment & Plan Note (Signed)
Controlled. Continue lisinopril 5mg  and amlodipine 2.5mg 

## 2020-02-02 NOTE — Assessment & Plan Note (Signed)
Uncontrolled. Start trazodone 25-50mg  qhs or prn. He will let me know if sleep is not more restorative.advised repeat sleep study with pulmonology; he politely declines and would like to see if sleeps more soundly with trazodone. Close follow up.

## 2020-02-02 NOTE — Progress Notes (Signed)
Subjective:    Patient ID: Jack Lewis, male    DOB: 08/16/1980, 39 y.o.   MRN: [id number]  CC: Jack Lewis is a 39 y.o. male who presents today for follow up.   HPI: Here to complete employer form , regarding BMI, for employer.  Working out 5 days a week doing weight lifting. No sob, cp, leg swelling.  Follows low carb diet and focused on eating less fat in diet.  Build has always been muscular and he feels healthy at current weight.   Complains of trouble sleeping, mainly going to sleep . 'always been that way.'  Describes himself as a light sleeper and cannot stay asleep. Mind 'is wide open' thinking about work, home projects.   Has done sleep study in the past and per patient, negative. Snores. No ha. Sleep isnt restorative.  No depression.  Takes caffeine tablet 200mg  qam. Tried melatonin without relief.   HTN- compliant with lisinopril 5mg .    HISTORY:  Past Medical History:  Diagnosis Date  . Allergy   . Hypertension   . Vertigo    Past Surgical History:  Procedure Laterality Date  . APPENDECTOMY    . VASECTOMY    . VASECTOMY REVERSAL     Family History  Problem Relation Age of Onset  . Hypertension Mother   . Diabetes Father   . Hypertension Father   . Heart attack Paternal Grandfather 62    Allergies: No known allergies Current Outpatient Medications on File Prior to Visit  Medication Sig Dispense Refill  . amLODipine (NORVASC) 2.5 MG tablet Take 1 tablet (2.5 mg total) by mouth daily. 90 tablet 3  . loratadine (CLARITIN) 10 MG tablet Take 10 mg by mouth daily.     No current facility-administered medications on file prior to visit.    Social History   Tobacco Use  . Smoking status: Never Smoker  . Smokeless tobacco: Never Used  Vaping Use  . Vaping Use: Never used  Substance Use Topics  . Alcohol use: Yes    Alcohol/week: 0.0 standard drinks    Comment: occasional  . Drug use: No    Review of Systems  Constitutional: Negative for  chills and fever.  Respiratory: Negative for cough.   Cardiovascular: Negative for chest pain, palpitations and leg swelling.  Gastrointestinal: Negative for nausea and vomiting.  Psychiatric/Behavioral: Positive for sleep disturbance. Negative for suicidal ideas. The patient is not nervous/anxious.       Objective:    BP 104/78   Pulse 68   Temp 98.5 F (36.9 C)   Ht 5\' 7"  (1.702 m)   Wt 223 lb 9.6 oz (101.4 kg)   SpO2 98%   BMI 35.02 kg/m  BP Readings from Last 3 Encounters:  02/02/20 104/78  07/31/19 104/73  05/23/19 (!) 152/90   Wt Readings from Last 3 Encounters:  02/02/20 223 lb 9.6 oz (101.4 kg)  07/31/19 213 lb 6.4 oz (96.8 kg)  05/23/19 236 lb 9.6 oz (107.3 kg)    Physical Exam Vitals reviewed.  Constitutional:      Appearance: He is well-developed.  Cardiovascular:     Rate and Rhythm: Regular rhythm.     Heart sounds: Normal heart sounds.  Pulmonary:     Effort: Pulmonary effort is normal. No respiratory distress.     Breath sounds: Normal breath sounds. No wheezing, rhonchi or rales.  Skin:    General: Skin is warm and dry.  Neurological:     Mental  Status: He is alert.  Psychiatric:        Speech: Speech normal.        Behavior: Behavior normal.        Assessment & Plan:   Problem List Items Addressed This Visit      Cardiovascular and Mediastinum   Hypertension - Primary    Controlled. Continue lisinopril 5mg  and amlodipine 2.5mg       Relevant Medications   lisinopril (ZESTRIL) 5 MG tablet   Other Relevant Orders   Comprehensive metabolic panel   Lipid panel   Hemoglobin A1c     Other   Insomnia    Uncontrolled. Start trazodone 25-50mg  qhs or prn. He will let me know if sleep is not Lewis restorative.advised repeat sleep study with pulmonology; he politely declines and would like to see if sleeps Lewis soundly with trazodone. Close follow up.      Relevant Orders   Comprehensive metabolic panel   Obesity (BMI without  comorbidity)    Discussed following low carb diet with attention to not eating unhealthy fats ( bacon, sausage) in setting of his family cardiac history. Advised that I didn't feel his BMI was entirely accurate as he is very muscular build. Advised referral to [facility], he will think about this in the future.       Other Visit Diagnoses    Encounter for hepatitis C screening test for low risk patient       Relevant Orders   Hepatitis C antibody       I have discontinued 93.7-16.9 Jack Lewis's lisinopril and cetirizine. I have also changed his lisinopril. Additionally, I am having him start on traZODone. Lastly, I am having him maintain his loratadine and amLODipine.   Meds ordered this encounter  Medications  . traZODone (DESYREL) 50 MG tablet    Sig: Take 0.5-1 tablets (25-50 mg total) by mouth at bedtime as needed for sleep.    Dispense:  30 tablet    Refill:  3    Order Specific Question:   Supervising Provider    Answer:   Jack Lewis [[id number]]  . lisinopril (ZESTRIL) 5 MG tablet    Sig: Take 1 tablet (5 mg total) by mouth daily.    Dispense:  90 tablet    Refill:  1    Order Specific Question:   Supervising Provider    Answer:   Jack Lewis [[id number]]    Return precautions given.   Risks, benefits, and alternatives of the medications and treatment plan prescribed today were discussed, and patient expressed understanding.   Education regarding symptom management and diagnosis given to patient on AVS.  Continue to follow with Jack Shams, FNP for routine health maintenance.   Jack Lewis and I agreed with plan.   Jack Moan, FNP

## 2020-02-02 NOTE — Patient Instructions (Signed)
Continue lisinopril 5mg  May use trazodone every night, or as needed Let me know how your sleep changes and if we need to pursue sleep study evaluation

## 2020-02-02 NOTE — Addendum Note (Signed)
Addended by: Bonnell Public I on: 02/02/2020 11:44 AM   Modules accepted: Orders

## 2020-02-03 LAB — COMPREHENSIVE METABOLIC PANEL
ALT: 38 IU/L (ref 0–44)
AST: 29 IU/L (ref 0–40)
Albumin/Globulin Ratio: 2 (ref 1.2–2.2)
Albumin: 4.7 g/dL (ref 4.0–5.0)
Alkaline Phosphatase: 68 IU/L (ref 44–121)
BUN/Creatinine Ratio: 10 (ref 9–20)
BUN: 10 mg/dL (ref 6–20)
Bilirubin Total: 0.4 mg/dL (ref 0.0–1.2)
CO2: 26 mmol/L (ref 20–29)
Calcium: 10.1 mg/dL (ref 8.7–10.2)
Chloride: 103 mmol/L (ref 96–106)
Creatinine, Ser: 1 mg/dL (ref 0.76–1.27)
GFR calc Af Amer: 111 mL/min/{1.73_m2} (ref >59)
GFR calc non Af Amer: 96 mL/min/{1.73_m2} (ref >59)
Globulin, Total: 2.3 g/dL (ref 1.5–4.5)
Glucose: 92 mg/dL (ref 65–99)
Potassium: 4.3 mmol/L (ref 3.5–5.2)
Sodium: 140 mmol/L (ref 134–144)
Total Protein: 7 g/dL (ref 6.0–8.5)

## 2020-02-03 LAB — LIPID PANEL
Chol/HDL Ratio: 3.7 ratio (ref 0.0–5.0)
Cholesterol, Total: 214 mg/dL — ABNORMAL HIGH (ref 100–199)
HDL: 58 mg/dL (ref >39)
LDL Chol Calc (NIH): 147 mg/dL — ABNORMAL HIGH (ref 0–99)
Triglycerides: 52 mg/dL (ref 0–149)
VLDL Cholesterol Cal: 9 mg/dL (ref 5–40)

## 2020-02-03 LAB — HEMOGLOBIN A1C
Est. average glucose Bld gHb Est-mCnc: 105 mg/dL
Hgb A1c MFr Bld: 5.3 % (ref 4.8–5.6)

## 2020-02-03 LAB — HEPATITIS C ANTIBODY: Hep C Virus Ab: <0.1 s/co ratio (ref 0.0–0.9)

## 2020-02-15 ENCOUNTER — Other Ambulatory Visit: Payer: Self-pay

## 2020-02-15 ENCOUNTER — Encounter: Payer: Self-pay | Admitting: Nurse Practitioner

## 2020-02-15 ENCOUNTER — Telehealth (INDEPENDENT_AMBULATORY_CARE_PROVIDER_SITE_OTHER): Payer: Managed Care, Other (non HMO) | Admitting: Nurse Practitioner

## 2020-02-15 VITALS — BP 130/75 | HR 75 | Temp 98.1°F | Ht 67.0 in | Wt 220.0 lb

## 2020-02-15 DIAGNOSIS — Z6834 Body mass index (BMI) 34.0-34.9, adult: Secondary | ICD-10-CM | POA: Diagnosis not present

## 2020-02-15 DIAGNOSIS — U071 COVID-19: Secondary | ICD-10-CM | POA: Diagnosis not present

## 2020-02-15 DIAGNOSIS — E6609 Other obesity due to excess calories: Secondary | ICD-10-CM | POA: Diagnosis not present

## 2020-02-15 DIAGNOSIS — I10 Essential (primary) hypertension: Secondary | ICD-10-CM

## 2020-02-15 NOTE — Progress Notes (Signed)
Virtual Visit via Video Note  This visit type was conducted due to national recommendations for restrictions regarding the COVID-19 pandemic (e.g. social distancing).  This format is felt to be most appropriate for this patient at this time.  All issues noted in this document were discussed and addressed.  No physical exam was performed (except for noted visual exam findings with Video Visits).   I connected with@ on 02/15/20 at  4:30 PM EDT by a video enabled telemedicine application or telephone and verified that I am speaking with the correct person using two identifiers. Location patient: home Location provider: work or home office Persons participating in the virtual visit: patient, provider  I discussed the limitations, risks, security and privacy concerns of performing an evaluation and management service by telephone and the availability of in person appointments. I also discussed with the patient that there may be a patient responsible charge related to this service. The patient expressed understanding and agreed to proceed.  Reason for visit: Covid positive  HPI: Jack Lewis is a 39 year old male with history of hypertension, obesity with BMI 34.46 who reports onset of sinus congestion on 02/05/20. He took at home Covid test x2 that was negative.  He treated this with Mucinex DM to help congestion.  He developed a slight sore throat, little hoarseness.  This Tuesday, he developed little shortness of breath while he was working out at the gym that was unusual.  He had no cough, or congestion in the chest, wheezing or chest tightness.  He had to stop and take breaks which was very unusual for him. Onset yesterday of a mild headache all day. He took Tylenol.  He took another home Covid test and this time it immediately turned positive.  He had diarrhea today that was pretty rough, 6 movements, mild cramps before the diarrhea.  No blood or melena.  No nausea or vomiting.  But he does feel weak and  fatigued today.He has been up and down the stairs many times today and has no  SOB . No headache today.  Nasal congestion in the am with yellow discharge, but improved during the day. No cough.  No fevers.  He has been checking his temperature has been ranging 97-98.2.  Pulse oximetry is 97% or greater.  He is staying super well-hydrated drinking Powerade Zero.  He always drinks that and it does not normally give him diarrhea. He lives with his wife and 21 yo twins- no family members ill. He is staying away from them and is quarantined at home.   ROS: See pertinent positives and negatives per HPI.  Past Medical History:  Diagnosis Date  . Allergy   . Hypertension   . Vertigo     Past Surgical History:  Procedure Laterality Date  . APPENDECTOMY    . VASECTOMY    . VASECTOMY REVERSAL      Family History  Problem Relation Age of Onset  . Hypertension Mother   . Diabetes Father   . Hypertension Father   . Heart attack Paternal Grandfather 58    SOCIAL HX: no tobacco   Current Outpatient Medications:  .  amLODipine (NORVASC) 2.5 MG tablet, Take 1 tablet (2.5 mg total) by mouth daily., Disp: 90 tablet, Rfl: 3 .  lisinopril (ZESTRIL) 5 MG tablet, Take 1 tablet (5 mg total) by mouth daily., Disp: 90 tablet, Rfl: 1 .  loratadine (CLARITIN) 10 MG tablet, Take 10 mg by mouth daily., Disp: , Rfl:  .  traZODone (  DESYREL) 50 MG tablet, Take 0.5-1 tablets (25-50 mg total) by mouth at bedtime as needed for sleep., Disp: 30 tablet, Rfl: 3  EXAM:  VITALS per patient if applicable:wt 220 lbs, 98.1-75- 119/70 rihgt and 130/75 with pulse oxygen 97%  GENERAL: alert, oriented, appears well and in no acute distress  HEENT: atraumatic, conjunctiva clear, no obvious abnormalities on inspection of external nose and ears  NECK: normal movements of the head and neck  LUNGS: on inspection no signs of respiratory distress, breathing rate appears normal, no obvious gross SOB, gasping or wheezing. No  cough   CV: no obvious cyanosis  MS: moves all visible extremities without noticeable abnormality  PSYCH/NEURO: pleasant and cooperative, no obvious depression or anxiety, speech and thought processing grossly intact  ASSESSMENT AND PLAN:  Discussed the following assessment and plan:  COVID-19 virus infection  Class 1 obesity due to excess calories without serious comorbidity with body mass index (BMI) of 34.0 to 34.9 in adult  Primary hypertension  No problem-specific Assessment & Plan notes found for this encounter.  You have tested positive for Covid.  We have given your name to the monoclonal antibody infusion team and you should get a call tomorrow from them.   Please begin Vit C 1000 mg daily ( this can be a stomach irritant- so your lower dose maybe better for now while you have diarrhea).   Vitamin D3 4000 IU daily  Zinc 100 g daily  You have diarrhea and advise eating soft, bland,  low fiber foods. Advise not to drink  a lot of artifical sweeteners or high sugary drinks as that can make diarrhea worse. Hydrate with water, water with splash of juice, green tea, avoid carbonation and caffeine.   Monitor for worsening sinus symptoms. You may get a secondary sinus infection unrelated to Covid.   Monitor pulse oximeter oxygen levels.  If your oxygen level falls to 94% and stays there- you need to go to the [facility] or the [facility] in for in-person examination. If it fall below 90% and stays there,  go to the  Emergency Room  for urgent assistance. Seek  treatment in the Emergency Room if respiratory issues/distress develops or unrelieved chest pain.   Rest, hydrate very well, eat healthy protein food, Tylenol as directed .   Remain in self quarantine until at least 10 days since symptom onset AND 3 consecutive days fever free without antipyretics AND improvement in respiratory symptoms.   Only leave home to seek medical care and must wear a mask in  public. Limit contact with family members or caregivers in the home and to notify her family who she was with yesterday that they should quarantine for 14 days. Practice social distancing and to continue to use good preventative care measures such has frequent hand washing.   I discussed the assessment and treatment plan with the patient. The patient was provided an opportunity to ask questions and all were answered. The patient agreed with the plan and demonstrated an understanding of the instructions.   The patient was advised to call back or seek an in-person evaluation if the symptoms worsen or if the condition fails to improve as anticipated.  Amedeo Kinsman, NP Adult Nurse Practitioner [facility] [employer] [phone]

## 2020-02-15 NOTE — Patient Instructions (Addendum)
You have tested positive for Covid.  We have given your name to the monoclonal antibody infusion team and you should get a call tomorrow from them.   Please begin Vit C 1000 mg daily ( this can be a stomach irritant- so your lower dose maybe better for now while you have diarrhea).   Vitamin D3 4000 IU daily  Zinc 100 g daily  You have diarrhea and advise eating soft, bland,  low fiber foods. Advise not to drink  a lot of artifical sweeteners or high sugary drinks as that can make diarrhea worse. Hydrate with water, water with splash of juice, green tea, avoid carbonation and caffeine.   Monitor for worsening sinus symptoms. You may get a secondary sinus infection unrelated to Covid.   Monitor pulse oximeter oxygen levels.  If your oxygen level falls to 94% and stays there- you need to go to the [facility] or the [facility] in for in-person examination. If it fall below 90% and stays there,  go to the  Emergency Room  for urgent assistance. Seek  treatment in the Emergency Room if respiratory issues/distress develops or unrelieved chest pain.   Rest, hydrate very well, eat healthy protein food, Tylenol as directed .   Remain in self quarantine until at least 10 days since symptom onset AND 3 consecutive days fever free without antipyretics AND improvement in respiratory symptoms.   Only leave home to seek medical care and must wear a mask in public. Limit contact with family members or caregivers in the home and to notify her family who she was with yesterday that they should quarantine for 14 days. Practice social distancing and to continue to use good preventative care measures such has frequent hand washing.   10 Things You Can Do to Manage Your COVID-19 Symptoms at Home If you have possible or confirmed COVID-19: 1. Stay home from work and school. And stay away from other public places. If you must go out, avoid using any kind of public transportation, ridesharing, or  taxis. 2. Monitor your symptoms carefully. If your symptoms get worse, call your healthcare provider immediately. 3. Get rest and stay hydrated. 4. If you have a medical appointment, call the healthcare provider ahead of time and tell them that you have or may have COVID-19. 5. For medical emergencies, call 911 and notify the dispatch personnel that you have or may have COVID-19. 6. Cover your cough and sneezes with a tissue or use the inside of your elbow. 7. Wash your hands often with soap and water for at least 20 seconds or clean your hands with an alcohol-based hand sanitizer that contains at least 60% alcohol. 8. As much as possible, stay in a specific room and away from other people in your home. Also, you should use a separate bathroom, if available. If you need to be around other people in or outside of the home, wear a mask. 9. Avoid sharing personal items with other people in your household, like dishes, towels, and bedding. 10. Clean all surfaces that are touched often, like counters, tabletops, and doorknobs. Use household cleaning sprays or wipes according to the label instructions. [employer] 10/19/2018 This information is not intended to replace advice given to you by your health care provider. Make sure you discuss any questions you have with your health care provider. Document Revised: 03/23/2019 Document Reviewed: 03/23/2019 Elsevier Patient Education  2020 Elsevier Inc.  Diarrhea, Adult Diarrhea is frequent loose and watery bowel  movements. Diarrhea can make you feel weak and cause you to become dehydrated. Dehydration can make you tired and thirsty, cause you to have a dry mouth, and decrease how often you urinate. Diarrhea typically lasts 2-3 days. However, it can last longer if it is a sign of something more serious. It is important to treat your diarrhea as told by your health care provider. Follow these instructions at home: Eating and drinking     Follow  these recommendations as told by your health care provider:  Take an oral rehydration solution (ORS). This is an over-the-counter medicine that helps return your body to its normal balance of nutrients and water. It is found at pharmacies and retail stores.  Drink plenty of fluids, such as water, ice chips, diluted fruit juice, and low-calorie sports drinks. You can drink milk also, if desired.  Avoid drinking fluids that contain a lot of sugar or caffeine, such as energy drinks, sports drinks, and soda.  Eat bland, easy-to-digest foods in small amounts as you are able. These foods include bananas, applesauce, rice, lean meats, toast, and crackers.  Avoid alcohol.  Avoid spicy or fatty foods.  Medicines  Take over-the-counter and prescription medicines only as told by your health care provider.  If you were prescribed an antibiotic medicine, take it as told by your health care provider. Do not stop using the antibiotic even if you start to feel better. General instructions   Wash your hands often using soap and water. If soap and water are not available, use a hand sanitizer. Others in the household should wash their hands as well. Hands should be washed: ? After using the toilet or changing a diaper. ? Before preparing, cooking, or serving food. ? While caring for a sick person or while visiting someone in a hospital.  Drink enough fluid to keep your urine pale yellow.  Rest at home while you recover.  Watch your condition for any changes.  Take a warm bath to relieve any burning or pain from frequent diarrhea episodes.  Keep all follow-up visits as told by your health care provider. This is important. Contact a health care provider if:  You have a fever.  Your diarrhea gets worse.  You have new symptoms.  You cannot keep fluids down.  You feel light-headed or dizzy.  You have a headache.  You have muscle cramps. Get help right away if:  You have chest  pain.  You feel extremely weak or you faint.  You have bloody or black stools or stools that look like tar.  You have severe pain, cramping, or bloating in your abdomen.  You have trouble breathing or you are breathing very quickly.  Your heart is beating very quickly.  Your skin feels cold and clammy.  You feel confused.  You have signs of dehydration, such as: ? Dark urine, very little urine, or no urine. ? Cracked lips. ? Dry mouth. ? Sunken eyes. ? Sleepiness. ? Weakness. Summary  Diarrhea is frequent loose and watery bowel movements. Diarrhea can make you feel weak and cause you to become dehydrated.  Drink enough fluids to keep your urine pale yellow.  Make sure that you wash your hands after using the toilet. If soap and water are not available, use hand sanitizer.  Contact a health care provider if your diarrhea gets worse or you have new symptoms.  Get help right away if you have signs of dehydration. This information is not intended to replace advice given  to you by your health care provider. Make sure you discuss any questions you have with your health care provider. Document Revised: 08/23/2018 Document Reviewed: 09/10/2017 Elsevier Patient Education  2020 [employer].

## 2020-02-16 ENCOUNTER — Telehealth (HOSPITAL_COMMUNITY): Payer: Self-pay

## 2020-02-16 ENCOUNTER — Telehealth: Payer: Self-pay | Admitting: Nurse Practitioner

## 2020-02-16 NOTE — Telephone Encounter (Signed)
Called to Discuss with patient about Covid symptoms and the use of the monoclonal antibody infusion for those with mild to moderate Covid symptoms and at a high risk of hospitalization.   °  °Pt refused. °

## 2020-02-16 NOTE — Telephone Encounter (Signed)
I called for sx update and his diarrhea is over. No BM today. He feels much better- worked out in home gym and has no DOE. His sinus congestion is also much better. PLAN: call back next week if sinus sx worsen.

## 2020-02-20 ENCOUNTER — Other Ambulatory Visit: Payer: Self-pay

## 2020-02-20 ENCOUNTER — Encounter: Payer: Self-pay | Admitting: Emergency Medicine

## 2020-02-20 ENCOUNTER — Emergency Department: Payer: Managed Care, Other (non HMO)

## 2020-02-20 DIAGNOSIS — U071 COVID-19: Secondary | ICD-10-CM | POA: Insufficient documentation

## 2020-02-20 DIAGNOSIS — Z79899 Other long term (current) drug therapy: Secondary | ICD-10-CM | POA: Insufficient documentation

## 2020-02-20 DIAGNOSIS — R0602 Shortness of breath: Secondary | ICD-10-CM | POA: Diagnosis present

## 2020-02-20 DIAGNOSIS — I10 Essential (primary) hypertension: Secondary | ICD-10-CM | POA: Insufficient documentation

## 2020-02-20 LAB — TROPONIN I (HIGH SENSITIVITY)
Troponin I (High Sensitivity): 3 ng/L (ref <18)
Troponin I (High Sensitivity): 3 ng/L (ref <18)

## 2020-02-20 LAB — CBC
HCT: 41.2 % (ref 39.0–52.0)
Hemoglobin: 14.3 g/dL (ref 13.0–17.0)
MCH: 30.7 pg (ref 26.0–34.0)
MCHC: 34.7 g/dL (ref 30.0–36.0)
MCV: 88.4 fL (ref 80.0–100.0)
Platelets: 249 10*3/uL (ref 150–400)
RBC: 4.66 MIL/uL (ref 4.22–5.81)
RDW: 12.2 % (ref 11.5–15.5)
WBC: 7.9 10*3/uL (ref 4.0–10.5)
nRBC: 0 % (ref 0.0–0.2)

## 2020-02-20 LAB — BASIC METABOLIC PANEL
Anion gap: 13 (ref 5–15)
BUN: 15 mg/dL (ref 6–20)
CO2: 27 mmol/L (ref 22–32)
Calcium: 10.1 mg/dL (ref 8.9–10.3)
Chloride: 100 mmol/L (ref 98–111)
Creatinine, Ser: 1.13 mg/dL (ref 0.61–1.24)
GFR, Estimated: >60 mL/min (ref >60)
Glucose, Bld: 91 mg/dL (ref 70–99)
Potassium: 4 mmol/L (ref 3.5–5.1)
Sodium: 140 mmol/L (ref 135–145)

## 2020-02-20 NOTE — ED Triage Notes (Signed)
Pt arrived via POV with reports of shortness of breath and chest pain worse over the past day, pt reports he was dx with COVID last week on 10/27 with Home test.    PT also went to [facility] for testing and was positive 10/30.  Pt also reports cough as well.  Pt reports sinus infection sxs 2 weeks from Monday.   Pt able to speak in complete sentences without running out of breath.

## 2020-02-21 ENCOUNTER — Encounter: Payer: Self-pay | Admitting: Family

## 2020-02-21 ENCOUNTER — Emergency Department
Admission: EM | Admit: 2020-02-21 | Discharge: 2020-02-21 | Disposition: A | Discharge status: Home / Self Care | Payer: Managed Care, Other (non HMO) | Attending: Emergency Medicine | Admitting: Emergency Medicine

## 2020-02-21 ENCOUNTER — Emergency Department: Payer: Managed Care, Other (non HMO)

## 2020-02-21 DIAGNOSIS — U071 COVID-19: Secondary | ICD-10-CM

## 2020-02-21 DIAGNOSIS — R091 Pleurisy: Secondary | ICD-10-CM

## 2020-02-21 MED ORDER — ASPIRIN 81 MG PO CHEW
324.0000 mg | CHEWABLE_TABLET | Freq: Once | ORAL | Status: AC
  Administered 2020-02-21: 324 mg via ORAL
  Filled 2020-02-21: qty 4

## 2020-02-21 MED ORDER — IOHEXOL 350 MG/ML SOLN
75.0000 mL | Freq: Once | INTRAVENOUS | Status: AC | PRN
  Administered 2020-02-21: 75 mL via INTRAVENOUS

## 2020-02-21 NOTE — ED Notes (Signed)
E-signature not working at this time. Pt verbalized understanding of D/C instructions, prescriptions and follow up care with no further questions at this time. Pt in NAD and ambulatory at time of D/C.  

## 2020-02-21 NOTE — ED Provider Notes (Signed)
[facility] Emergency Department Provider Note  ____________________________________________   First MD Initiated Contact with Patient 02/21/20 [phone]     (approximate)  I have reviewed the triage vital signs and the nursing notes.   HISTORY  Chief Complaint Shortness of Breath (COVID +) and Chest Pain   HPI Jack Lewis is a 39 y.o. male with below list of previous medical conditions including recent COVID-19 infection.  Patient states that he started feeling ill about 2 weeks ago took a home test on the 27th and then subsequently tested at [facility] on the 30th that was both positive.  Patient now presents to the emergency department secondary to bilateral chest pain is worse with inspiration.  Patient states that current pain score is 2 out of 10.  Patient also admits to dyspnea with exertion.  Patient denies any fever afebrile on presentation.        Past Medical History:  Diagnosis Date  . Allergy   . Hypertension   . Vertigo     Patient Active Problem List   Diagnosis Date Noted  . COVID-19 virus infection 02/15/2020  . Class 1 obesity due to excess calories without serious comorbidity with body mass index (BMI) of 34.0 to 34.9 in adult 02/15/2020  . Insomnia 02/02/2020  . Obesity (BMI 35.0-39.9 without comorbidity) 02/02/2017  . Hypertension 07/30/2015    Past Surgical History:  Procedure Laterality Date  . APPENDECTOMY    . VASECTOMY    . VASECTOMY REVERSAL      Prior to Admission medications   Medication Sig Start Date End Date Taking? Authorizing Provider  amLODipine (NORVASC) 2.5 MG tablet Take 1 tablet (2.5 mg total) by mouth daily. 06/19/19   [name], FNP  lisinopril (ZESTRIL) 5 MG tablet Take 1 tablet (5 mg total) by mouth daily. 02/02/20   [name], FNP  loratadine (CLARITIN) 10 MG tablet Take 10 mg by mouth daily.    [provider]  traZODone (DESYREL) 50 MG tablet Take 0.5-1 tablets (25-50 mg total)  by mouth at bedtime as needed for sleep. 02/02/20   [name], FNP    Allergies Patient has no known allergies.  Family History  Problem Relation Age of Onset  . Hypertension Mother   . Diabetes Father   . Hypertension Father   . Heart attack Paternal Grandfather 57    Social History Social History   Tobacco Use  . Smoking status: Never Smoker  . Smokeless tobacco: Never Used  Vaping Use  . Vaping Use: Never used  Substance Use Topics  . Alcohol use: Yes    Alcohol/week: 0.0 standard drinks    Comment: occasional  . Drug use: No    Review of Systems Constitutional: No fever/chills Eyes: No visual changes. ENT: No sore throat. Cardiovascular: Positive for chest pain. Respiratory: Positive for shortness of breath. Gastrointestinal: No abdominal pain.  No nausea, no vomiting.  No diarrhea.  No constipation. Genitourinary: Negative for dysuria. Musculoskeletal: Negative for neck pain.  Negative for back pain. Integumentary: Negative for rash. Neurological: Negative for headaches, focal weakness or numbness.  ____________________________________________   PHYSICAL EXAM:  VITAL SIGNS: ED Triage Vitals  Enc Vitals Group     BP 02/20/20 1946 137/77     Pulse Rate 02/20/20 1946 87     Resp 02/20/20 1946 18     Temp 02/20/20 1946 98.3 F (36.8 C)     Temp Source 02/20/20 1946 Oral  SpO2 02/20/20 1946 100 %     Weight 02/20/20 1947 99.8 kg (220 lb)     Height 02/20/20 1947 1.702 m (5\' 7" )     Head Circumference --      Peak Flow --      Pain Score 02/20/20 1947 2     Pain Loc --      Pain Edu? --      Excl. in GC? --     Constitutional: Alert and oriented.  Eyes: Conjunctivae are normal.  Head: Atraumatic. Mouth/Throat: Patient is wearing a mask. Neck: No stridor.  No meningeal signs.   Cardiovascular: Normal rate, regular rhythm. Good peripheral circulation. Grossly normal heart sounds. Respiratory: Normal respiratory effort.  No  retractions. Gastrointestinal: Soft and nontender. No distention.  Musculoskeletal: No lower extremity tenderness nor edema. No gross deformities of extremities. Neurologic:  Normal speech and language. No gross focal neurologic deficits are appreciated.  Skin:  Skin is warm, dry and intact. Psychiatric: Mood and affect are normal. Speech and behavior are normal.  ____________________________________________   LABS (all labs ordered are listed, but only abnormal results are displayed)  Labs Reviewed  BASIC METABOLIC PANEL  CBC  TROPONIN I (HIGH SENSITIVITY)  TROPONIN I (HIGH SENSITIVITY)   ____________________________________________  EKG  ED ECG REPORT I, [place] N Agustine Rossitto, the attending physician, personally viewed and interpreted this ECG.   Date: 02/21/2020  EKG Time: 7:44 PM  Rate: 83  Rhythm: Normal sinus rhythm  Axis: Normal  Intervals: Normal  ST&T Change: None  ____________________________________________  RADIOLOGY I, [place] N Winnifred Dufford, personally viewed and evaluated these images (plain radiographs) as part of my medical decision making, as well as reviewing the written report by the radiologist.  ED MD interpretation: No active cardiopulmonary disease noted on chest x-ray per radiologist.    Official radiology report(s): DG Chest 2 View  Result Date: 02/20/2020 CLINICAL DATA:  Chest pain. EXAM: CHEST - 2 VIEW COMPARISON:  July 12, 2012. FINDINGS: The heart size and mediastinal contours are within normal limits. Both lungs are clear. No pneumothorax or pleural effusion is noted. The visualized skeletal structures are unremarkable. IMPRESSION: No active cardiopulmonary disease. Electronically Signed   By: July 14, 2012 M.D.   On: 02/20/2020 20:21      Procedures   ____________________________________________   INITIAL IMPRESSION / MDM / ASSESSMENT AND PLAN / ED COURSE  As part of my medical decision making, I reviewed the following data within the  electronic MEDICAL RECORD NUMBER 39 year old male presented with above-stated history and physical exam a differential diagnosis including but not limited to COVID-19 infection, Covid pneumonia, pulmonary emboli, ACS, pericarditis.  EKG revealed no evidence of ischemia or infarction, no ST segment elevation or PR depression.  Laboratory data including high-sensitivity troponin negative x2.  CT scan revealed no evidence of pulmonary emboli or any other gross pathology.  I spoke with the patient at length regarding warning signs I would warrant immediate return to the emergency department.  ____________________________________________  FINAL CLINICAL IMPRESSION(S) / ED DIAGNOSES  Final diagnoses:  Pleurisy  COVID     MEDICATIONS GIVEN DURING THIS VISIT:  Medications  iohexol (OMNIPAQUE) 350 MG/ML injection 75 mL (75 mLs Intravenous Contrast Given 02/21/20 13/3/21)     ED Discharge Orders    None      *Please note:  CALAHAN PAK was evaluated in Emergency Department on 02/21/2020 for the symptoms described in the history of present illness. He was evaluated in the context of  the global COVID-19 pandemic, which necessitated consideration that the patient might be at risk for infection with the SARS-CoV-2 virus that causes COVID-19. Institutional protocols and algorithms that pertain to the evaluation of patients at risk for COVID-19 are in a state of rapid change based on information released by regulatory bodies including the CDC and federal and state organizations. These policies and algorithms were followed during the patient's care in the ED.  Some ED evaluations and interventions may be delayed as a result of limited staffing during and after the pandemic.*  Note:  This document was prepared using Dragon voice recognition software and may include unintentional dictation errors.   Darci Current, MD 02/21/20 [id number]

## 2020-03-06 ENCOUNTER — Encounter: Payer: Self-pay | Admitting: Family

## 2020-03-06 ENCOUNTER — Ambulatory Visit: Payer: Managed Care, Other (non HMO) | Admitting: Family

## 2020-03-06 ENCOUNTER — Other Ambulatory Visit: Payer: Self-pay

## 2020-03-06 VITALS — BP 118/80 | HR 73 | Temp 98.2°F | Ht 67.0 in | Wt 225.0 lb

## 2020-03-06 DIAGNOSIS — R079 Chest pain, unspecified: Secondary | ICD-10-CM | POA: Insufficient documentation

## 2020-03-06 DIAGNOSIS — R7989 Other specified abnormal findings of blood chemistry: Secondary | ICD-10-CM | POA: Insufficient documentation

## 2020-03-06 DIAGNOSIS — I4510 Unspecified right bundle-branch block: Secondary | ICD-10-CM | POA: Diagnosis not present

## 2020-03-06 LAB — TSH: TSH: 1.31 u[IU]/mL (ref 0.35–4.50)

## 2020-03-06 LAB — MAGNESIUM: Magnesium: 2 mg/dL (ref 1.5–2.5)

## 2020-03-06 MED ORDER — DICLOFENAC SODIUM 1 % EX GEL: 4.0000 g | Freq: Four times a day (QID) | CUTANEOUS | 3 refills | Status: DC

## 2020-03-06 NOTE — Patient Instructions (Signed)
Trial of voltaren gel for musculoskeletal etiology  Let me know of any new concerns  Referral to cardiology Let us know if you dont hear back within a week in regards to an appointment being scheduled.    Please read about costochrondritis as we discussed     Costochondritis Costochondritis is swelling and irritation (inflammation) of the tissue (cartilage) that connects your ribs to your breastbone (sternum). This causes pain in the front of your chest. Usually, the pain:  Starts gradually.  Is in more than one rib. This condition usually goes away on its own over time. Follow these instructions at home:  Do not do anything that makes your pain worse.  If directed, put ice on the painful area: ? Put ice in a plastic bag. ? Place a towel between your skin and the bag. ? Leave the ice on for 20 minutes, 2-3 times a day.  If directed, put heat on the affected area as often as told by your doctor. Use the heat source that your doctor tells you to use, such as a moist heat pack or a heating pad. ? Place a towel between your skin and the heat source. ? Leave the heat on for 20-30 minutes. ? Take off the heat if your skin turns bright red. This is very important if you cannot feel pain, heat, or cold. You may have a greater risk of getting burned.  Take over-the-counter and prescription medicines only as told by your doctor.  Return to your normal activities as told by your doctor. Ask your doctor what activities are safe for you.  Keep all follow-up visits as told by your doctor. This is important. Contact a doctor if:  You have chills or a fever.  Your pain does not go away or it gets worse.  You have a cough that does not go away. Get help right away if:  You are short of breath. This information is not intended to replace advice given to you by your health care provider. Make sure you discuss any questions you have with your health care provider. Document Revised:  04/21/2017 Document Reviewed: 07/31/2015 Elsevier Patient Education  2020 [employer].

## 2020-03-06 NOTE — Progress Notes (Signed)
Subjective:    Patient ID: Jack Lewis, male    DOB: [birth year], 39 y.o.   MRN: [id number]  CC: Jack Lewis is a 39 y.o. male who presents today for follow up.   HPI: CC: Chest pain which started 16 days right sided. 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 Overall cp is unchanged and comes and goes. Describes as feeling of tightness. No SOB, cough, left arm numbness.   CP worsened when bending forward.  Pain minimal when sat straight up. No pain with walking or when at the gym. No cp with exercise.  Not exacerabted by eating or pressing on chest. No injury.   Took prilosec and tums daily for 7 days without relief. No epigastric burning, excessive burping, excessive burping, constipation.   Never smoker  Snores. No fatigue. Has had HA more often over 3 past weeks. No HA today.  Has been tested twice for sleep apnea which were negative.    Covid infection 3 weeks ago. At that time SOB with exertion, most noticeable when working out at gym.  Congestion and SOB since resolved. No leg swelling, fever.  covid vaccinated.   Would like testosterone to be checked; H/o testosterone replacement  Seen 14 days in ED for SOB.  CXR no acute findings Troponin negative x 2 CT angio negative for PE. No pericardial effusion. Mild air trapping left lower lobe No anemia, electrolyte disturbances    HISTORY:  Past Medical History:  Diagnosis Date  . Allergy   . Hypertension   . Vertigo    Past Surgical History:  Procedure Laterality Date  . APPENDECTOMY    . VASECTOMY    . VASECTOMY REVERSAL     Family History  Problem Relation Age of Onset  . Hypertension Mother   . Diabetes Father   . Hypertension Father   . Heart attack Father 66  . Heart attack Paternal Grandfather 12    Allergies: Patient has no known allergies. Current Outpatient Medications on File Prior to Visit  Medication Sig Dispense Refill  . amLODipine (NORVASC) 2.5 MG  tablet Take 1 tablet (2.5 mg total) by mouth daily. 90 tablet 3  . lisinopril (ZESTRIL) 5 MG tablet Take 1 tablet (5 mg total) by mouth daily. 90 tablet 1  . loratadine (CLARITIN) 10 MG tablet Take 10 mg by mouth daily.    . traZODone (DESYREL) 50 MG tablet Take 0.5-1 tablets (25-50 mg total) by mouth at bedtime as needed for sleep. 30 tablet 3   No current facility-administered medications on file prior to visit.    Social History   Tobacco Use  . Smoking status: Never Smoker  . Smokeless tobacco: Never Used  Vaping Use  . Vaping Use: Never used  Substance Use Topics  . Alcohol use: Yes    Alcohol/week: 0.0 standard drinks    Comment: occasional  . Drug use: No    Review of Systems  Constitutional: Negative for chills and fever.  Respiratory: Negative for cough and shortness of breath.   Cardiovascular: Positive for chest pain. Negative for palpitations and leg swelling.  Gastrointestinal: Negative for nausea and vomiting.  Musculoskeletal: Negative for back pain.  Neurological: Negative for numbness and headaches.      Objective:    BP 118/80 (BP Location: Left Arm, Patient Position: Sitting, Cuff Size: Normal)   Pulse 73   Temp 98.2 F (36.8 C) (Oral)   Ht 5\' 7"  (1.702 m)  Wt 225 lb (102.1 kg)   SpO2 98%   BMI 35.24 kg/m  BP Readings from Last 3 Encounters:  03/06/20 118/80  02/21/20 128/80  02/15/20 130/75   Wt Readings from Last 3 Encounters:  03/06/20 225 lb (102.1 kg)  02/20/20 220 lb (99.8 kg)  02/15/20 220 lb (99.8 kg)    Physical Exam Vitals reviewed.  Constitutional:      Appearance: He is well-developed.  Cardiovascular:     Rate and Rhythm: Regular rhythm.     Heart sounds: Normal heart sounds.  Pulmonary:     Effort: Pulmonary effort is normal. No respiratory distress.     Breath sounds: Normal breath sounds. No wheezing, rhonchi or rales.  Chest:     Chest wall: No tenderness.  Skin:    General: Skin is warm and dry.  Neurological:      Mental Status: He is alert.  Psychiatric:        Speech: Speech normal.        Behavior: Behavior normal.        Assessment & Plan:   Problem List Items Addressed This Visit      Cardiovascular and Mediastinum   RBBB    Chronic. Seen as far back as 2008 on EKG. OSA ( 2 of them) negative in the past.         Other   Chest pain - Primary    Etioogies unclear at this time. Reviewed ED course with patient. Atypical in nature and most unusual , CP increases with forward bend however not reproducible in office today. Question musculoskeletal etiology. Advised trial of volataren gel. Pending cardiology consult for further evaluation and in particular due to family history.       Relevant Medications   diclofenac Sodium (VOLTAREN) 1 % GEL   Other Relevant Orders   TSH (Completed)   Magnesium (Completed)   Ambulatory referral to Cardiology   Low testosterone   Relevant Orders   Testosterone , Free and Total       I am having [name] start on diclofenac Sodium. I am also having him maintain his loratadine, amLODipine, traZODone, and lisinopril.   Meds ordered this encounter  Medications  . diclofenac Sodium (VOLTAREN) 1 % GEL    Sig: Apply 4 g topically 4 (four) times daily.    Dispense:  50 g    Refill:  3    Order Specific Question:   Supervising Provider    Answer:   [name] [[id number]]    Return precautions given.   Risks, benefits, and alternatives of the medications and treatment plan prescribed today were discussed, and patient expressed understanding.   Education regarding symptom management and diagnosis given to patient on AVS.  Continue to follow with [name], FNP for routine health maintenance.   [name] and I agreed with plan.   [name], FNP  I have spent 35 minutes with a patient including precharting, exam, reviewing medical records, and discussion plan of care.

## 2020-03-07 DIAGNOSIS — I4510 Unspecified right bundle-branch block: Secondary | ICD-10-CM | POA: Insufficient documentation

## 2020-03-07 NOTE — Assessment & Plan Note (Signed)
Etioogies unclear at this time. Reviewed ED course with patient. Atypical in nature and most unusual , CP increases with forward bend however not reproducible in office today. Question musculoskeletal etiology. Advised trial of volataren gel. Pending cardiology consult for further evaluation and in particular due to family history.

## 2020-03-07 NOTE — Assessment & Plan Note (Signed)
Chronic. Seen as far back as 2008 on EKG. OSA ( 2 of them) negative in the past.

## 2020-03-10 LAB — TESTOSTERONE, FREE & TOTAL
Free Testosterone: 45.3 pg/mL (ref 35.0–155.0)
Testosterone, Total, LC-MS-MS: 449 ng/dL (ref 250–1100)

## 2020-03-28 ENCOUNTER — Ambulatory Visit: Payer: Managed Care, Other (non HMO) | Admitting: Cardiology

## 2020-03-28 ENCOUNTER — Other Ambulatory Visit: Payer: Self-pay

## 2020-03-28 ENCOUNTER — Encounter: Payer: Self-pay | Admitting: Cardiology

## 2020-03-28 VITALS — BP 120/80 | HR 80 | Ht 67.0 in | Wt 231.0 lb

## 2020-03-28 DIAGNOSIS — E7800 Pure hypercholesterolemia, unspecified: Secondary | ICD-10-CM | POA: Diagnosis not present

## 2020-03-28 DIAGNOSIS — I10 Essential (primary) hypertension: Secondary | ICD-10-CM | POA: Diagnosis not present

## 2020-03-28 DIAGNOSIS — R079 Chest pain, unspecified: Secondary | ICD-10-CM

## 2020-03-28 DIAGNOSIS — R072 Precordial pain: Secondary | ICD-10-CM

## 2020-03-28 MED ORDER — METOPROLOL TARTRATE 100 MG PO TABS: 100.0000 mg | ORAL_TABLET | Freq: Once | ORAL | 0 refills | Status: DC

## 2020-03-28 NOTE — Progress Notes (Signed)
Cardiology Office Note:    Date:  03/28/2020   ID:  Jack Lewis, DOB Apr 24, 1980, MRN [id number]  PCP:  Burnard Hawthorne, FNP  [facility] Cardiologist:  Kate Sable, MD  [place] Electrophysiologist:  None   Referring MD: Burnard Hawthorne, FNP   Chief Complaint  Patient presents with  . NEW patient-Referred by [name], NP for chest pain   Jack Lewis is a 39 y.o. male who is being seen today for the evaluation of chest pain at the request of Vidal Schwalbe Yvetta Coder, FNP.   History of Present Illness:    Jack Lewis is a 39 y.o. male with a hx of hypertension, hyperlipidemia who presents due to chest pain.  Patient states having symptoms of chest tightness over the past several weeks.  Symptoms are sometimes not related with exertion, occasionally occurs while bending over.  Initially he had right-sided chest pain which he rates as 6 out of 10, but then progressed to left-sided chest discomfort which was sharp in characteristic.  Over the past week, has noticed central chest pressure/tightness.  Is that heart an MI at age 26s.  He denies smoking but was exposed to secondhand smoke growing up for years.  Takes lisinopril for BP control.  Also endorses fatigue with exertion.  He initially thought this was due to deconditioning, has continued exercising by weight lifting and also walking at the gym but symptoms have not improved over the past 6 months.  Seen in the ED on 11/3 due to chest discomfort.  Work-up with EKG and troponin was normal.  Past Medical History:  Diagnosis Date  . Allergy   . Hypertension   . Vertigo     Past Surgical History:  Procedure Laterality Date  . APPENDECTOMY    . VASECTOMY    . VASECTOMY REVERSAL      Current Medications: Current Meds  Medication Sig  . diclofenac Sodium (VOLTAREN) 1 % GEL Apply 4 g topically 4 (four) times daily.  Marland Kitchen lisinopril (ZESTRIL) 5 MG tablet Take 1 tablet (5 mg total) by mouth daily.  Marland Kitchen  loratadine (CLARITIN) 10 MG tablet Take 10 mg by mouth daily.  . traZODone (DESYREL) 50 MG tablet Take 0.5-1 tablets (25-50 mg total) by mouth at bedtime as needed for sleep.     Allergies:   Patient has no known allergies.   Social History   Socioeconomic History  . Marital status: Married    Spouse name: Not on file  . Number of children: 3  . Years of education: Not on file  . Highest education level: Not on file  Occupational History  . Not on file  Tobacco Use  . Smoking status: Never Smoker  . Smokeless tobacco: Never Used  Vaping Use  . Vaping Use: Never used  Substance and Sexual Activity  . Alcohol use: Yes    Alcohol/week: 0.0 standard drinks    Comment: occasional  . Drug use: No  . Sexual activity: Yes    Birth control/protection: Surgical  Other Topics Concern  . Not on file  Social History Narrative   Married with 3 boys.    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  ROS:   Please see the history of present illness.     All other systems reviewed and are negative.  EKGs/Labs/Other Studies Reviewed:    The following studies were reviewed today:   EKG:  EKG is  ordered today.  The ekg ordered today demonstrates normal sinus rhythm, normal ECG.  Recent Labs: 02/02/2020: ALT 38 02/20/2020: BUN 15; Creatinine, Ser 1.13; Hemoglobin 14.3; Platelets 249; Potassium 4.0; Sodium 140 03/06/2020: Magnesium 2.0; TSH 1.31  Recent Lipid Panel    Component Value Date/Time   CHOL 214 (H) 02/02/2020 1145   TRIG  52 02/02/2020 1145   HDL 58 02/02/2020 1145   CHOLHDL 3.7 02/02/2020 1145   LDLCALC 147 (H) 02/02/2020 1145     Risk Assessment/Calculations:      Physical Exam:    VS:  BP 120/80 (BP Location: Right Arm, Patient Position: Sitting, Cuff Size: Large)   Pulse 80   Ht 5' 7"  (1.702 m)   Wt 231 lb (104.8 kg)   SpO2 98%   BMI 36.18 kg/m     Wt Readings from Last 3 Encounters:  03/28/20 231 lb (104.8 kg)  03/06/20 225 lb (102.1 kg)  02/20/20 220 lb (99.8 kg)     GEN:  Well nourished, well developed in no acute distress HEENT: Normal NECK: No JVD; No carotid bruits LYMPHATICS: No lymphadenopathy CARDIAC: RRR, no murmurs, rubs, gallops RESPIRATORY:  Clear to auscultation without rales, wheezing or rhonchi  ABDOMEN: Soft, non-tender, non-distended MUSCULOSKELETAL:  No edema; No deformity  SKIN: Warm and dry NEUROLOGIC:  Alert and oriented x 3 PSYCHIATRIC:  Normal affect   ASSESSMENT:    1. Precordial pain   2. Primary hypertension   3. Pure hypercholesterolemia   4. Chest pain, unspecified type    PLAN:    In order of problems listed above:  1. Patient with chest pain, some shortness of breath with exertion.  Get echo to evaluate cardiac function, get coronary CTA to evaluate presence of CAD. 2. Hypertension, BP control, continue lisinopril 3. Hyperlipidemia, low-cholesterol diet advised.  Follow-up after echo and coronary CTA.    Shared Decision Making/Informed Consent       Medication Adjustments/Labs and Tests Ordered: Current medicines are reviewed at length with the patient today.  Concerns regarding medicines are outlined above.  Orders Placed This Encounter  Procedures  . CT CORONARY MORPH W/CTA COR W/SCORE W/CA W/CM &/OR WO/CM  . CT CORONARY FRACTIONAL FLOW RESERVE DATA PREP  . CT CORONARY FRACTIONAL FLOW RESERVE FLUID ANALYSIS  . Basic metabolic panel  . EKG 12-Lead  . ECHOCARDIOGRAM COMPLETE   Meds ordered this encounter  Medications  .  metoprolol tartrate (LOPRESSOR) 100 MG tablet    Sig: Take 1 tablet (100 mg total) by mouth once for 1 dose. Take 2 hours prior to CT.    Dispense:  1 tablet    Refill:  0    Patient Instructions  Medication Instructions:  Your physician recommends that you continue on your current medications as directed. Please refer to the Current Medication list given to you today.  *If you need a refill on your cardiac medications before your next appointment, please call your pharmacy*   Lab Work: Your physician recommends that you return for lab work - within 1-2 weeks prior to  CT once it is scheduled. - BMET.  - Please go to the [facility]. You will check in at the front desk to the right as you walk into the atrium. Valet Parking is offered if needed. - No appointment needed. You may go any day between 7 am and 6 pm.  If you have labs (blood work) drawn today and your tests are completely normal, you will receive your results only by: Marland Kitchen MyChart Message (if you have MyChart) OR . A paper copy in the mail If you have any lab test that is abnormal or we need to change your treatment, we will call you to review the results.   Testing/Procedures: 1- Your physician has requested that you have an echocardiogram. Echocardiography is a painless test that uses sound waves to create images of your heart. It provides your doctor with information about the size and shape of your heart and how well your heart's chambers and valves are working. This procedure takes approximately one hour. There are no restrictions for this procedure. There is a possibility that an IV may need to be started during your test to inject an image enhancing agent. This is done to obtain more optimal pictures of your heart. Therefore we ask that you do at least drink some water prior to coming in to hydrate your veins.   2- Coronary(Cardiac) CTA  Your cardiac CT will be scheduled at one of the below locations:   [facility] [address] [place], [place] [address] (336) [place] [address] [place], [place] [address] [phone]  If scheduled at [facility], please arrive at the [facility] main entrance of [facility] 30 minutes prior to test start time. Proceed to the [facility] Radiology Department (first floor) to check-in and test prep.  If scheduled at [facility], please arrive 15 mins early for check-in and test prep.  Please follow these instructions carefully (unless otherwise directed):  Hold all erectile dysfunction medications at least 3 days (72 hrs) prior to test.  On the Night Before the Test: . Be sure to Drink plenty of water. . Do not consume any caffeinated/decaffeinated beverages or chocolate 12 hours prior to your test. . Do not take any antihistamines 12 hours prior to your test.  On the Day of the Test: . Drink plenty of water. Do not drink any water within one hour of the test. . Do not eat any food 4 hours prior to the test. . You may take your regular medications prior to the test.  . Take metoprolol (Lopressor) two hours prior to test. . HOLD Furosemide/Hydrochlorothiazide morning of the test.      After the Test: . Drink plenty of water. . After receiving IV contrast, you may experience a mild flushed feeling. This is normal. . On occasion, you may experience a mild rash up to 24 hours after the test. This is not dangerous. If this occurs, you can take Benadryl 25 mg and increase your fluid intake. . If you experience trouble breathing, this can be serious. If it is severe call 911 IMMEDIATELY. If it is mild, please call our office. . If you take any of these medications: Glipizide/Metformin, Avandament, Glucavance, please do not take 48 hours after completing test unless otherwise instructed.   Once we have confirmed authorization from your  insurance company, we will call you to set up a date  and time for your test. Based on how quickly your insurance processes prior authorizations requests, please allow up to 4 weeks to be contacted for scheduling your Cardiac CT appointment. Be advised that routine Cardiac CT appointments could be scheduled as many as 8 weeks after your provider has ordered it.  For non-scheduling related questions, please contact the cardiac imaging nurse navigator should you have any questions/concerns: Marchia Bond, Cardiac Imaging Nurse Navigator Burley Saver, Interim Cardiac Imaging Nurse [place] [facility] Direct Office Dial: [phone]   For scheduling needs, including cancellations and rescheduling, please call [place], [phone].     Follow-Up: At [facility], you and your health needs are our priority.  As part of our continuing mission to provide you with exceptional heart care, we have created designated Provider Care Teams.  These Care Teams include your primary Cardiologist (physician) and Advanced Practice Providers (APPs -  Physician Assistants and Nurse Practitioners) who all work together to provide you with the care you need, when you need it.  We recommend signing up for the patient portal called "MyChart".  Sign up information is provided on this After Visit Summary.  MyChart is used to connect with patients for Virtual Visits (Telemedicine).  Patients are able to view lab/test results, encounter notes, upcoming appointments, etc.  Non-urgent messages can be sent to your provider as well.   To learn more about what you can do with MyChart, go to [email or web address].    Your next appointment:   After testing approx. 6 weeks.   The format for your next appointment:   In Person  Provider:   You may see Kate Sable, MD or one of the following Advanced Practice Providers on your designated Care Team:    Murray Hodgkins, NP  Christell Faith,  PA-C  Marrianne Mood, PA-C  Cadence Miller, [place]  Laurann Montana, NP     Cardiac CT Angiogram A cardiac CT angiogram is a procedure to look at the heart and the area around the heart. It may be done to help find the cause of chest pains or other symptoms of heart disease. During this procedure, a substance called contrast dye is injected into the blood vessels in the area to be checked. A large X-ray machine, called a CT scanner, then takes detailed pictures of the heart and the surrounding area. The procedure is also sometimes called a coronary CT angiogram, coronary artery scanning, or CTA. A cardiac CT angiogram allows the health care provider to see how well blood is flowing to and from the heart. The health care provider will be able to see if there are any problems, such as:  Blockage or narrowing of the coronary arteries in the heart.  Fluid around the heart.  Signs of weakness or disease in the muscles, valves, and tissues of the heart. Tell a health care provider about:  Any allergies you have. This is especially important if you have had a previous allergic reaction to contrast dye.  All medicines you are taking, including vitamins, herbs, eye drops, creams, and over-the-counter medicines.  Any blood disorders you have.  Any surgeries you have had.  Any medical conditions you have.  Whether you are pregnant or may be pregnant.  Any anxiety disorders, chronic pain, or other conditions you have that may increase your stress or prevent you from lying still. What are the risks? Generally, this is a safe procedure. However, problems may occur, including:  Bleeding.  Infection.  Allergic reactions to  medicines or dyes.  Damage to other structures or organs.  Kidney damage from the contrast dye that is used.  Increased risk of cancer from radiation exposure. This risk is low. Talk with your health care provider about: ? The risks and benefits of testing. ? How  you can receive the lowest dose of radiation. What happens before the procedure?  Wear comfortable clothing and remove any jewelry, glasses, dentures, and hearing aids.  Follow instructions from your health care provider about eating and drinking. This may include: ? For 12 hours before the procedure -- avoid caffeine. This includes tea, coffee, soda, energy drinks, and diet pills. Drink plenty of water or other fluids that do not have caffeine in them. Being well hydrated can prevent complications. ? For 4-6 hours before the procedure -- stop eating and drinking. The contrast dye can cause nausea, but this is less likely if your stomach is empty.  Ask your health care provider about changing or stopping your regular medicines. This is especially important if you are taking diabetes medicines, blood thinners, or medicines to treat problems with erections (erectile dysfunction). What happens during the procedure?   Hair on your chest may need to be removed so that small sticky patches called electrodes can be placed on your chest. These will transmit information that helps to monitor your heart during the procedure.  An IV will be inserted into one of your veins.  You might be given a medicine to control your heart rate during the procedure. This will help to ensure that good images are obtained.  You will be asked to lie on an exam table. This table will slide in and out of the CT machine during the procedure.  Contrast dye will be injected into the IV. You might feel warm, or you may get a metallic taste in your mouth.  You will be given a medicine called nitroglycerin. This will relax or dilate the arteries in your heart.  The table that you are lying on will move into the CT machine tunnel for the scan.  The person running the machine will give you instructions while the scans are being done. You may be asked to: ? Keep your arms above your head. ? Hold your breath. ? Stay very  still, even if the table is moving.  When the scanning is complete, you will be moved out of the machine.  The IV will be removed. The procedure may vary among health care providers and hospitals. What can I expect after the procedure? After your procedure, it is common to have:  A metallic taste in your mouth from the contrast dye.  A feeling of warmth.  A headache from the nitroglycerin. Follow these instructions at home:  Take over-the-counter and prescription medicines only as told by your health care provider.  If you are told, drink enough fluid to keep your urine pale yellow. This will help to flush the contrast dye out of your body.  Most people can return to their normal activities right after the procedure. Ask your health care provider what activities are safe for you.  It is up to you to get the results of your procedure. Ask your health care provider, or the department that is doing the procedure, when your results will be ready.  Keep all follow-up visits as told by your health care provider. This is important. Contact a health care provider if:  You have any symptoms of allergy to the contrast dye. These include: ?  Shortness of breath. ? Rash or hives. ? A racing heartbeat. Summary  A cardiac CT angiogram is a procedure to look at the heart and the area around the heart. It may be done to help find the cause of chest pains or other symptoms of heart disease.  During this procedure, a large X-ray machine, called a CT scanner, takes detailed pictures of the heart and the surrounding area after a contrast dye has been injected into blood vessels in the area.  Ask your health care provider about changing or stopping your regular medicines before the procedure. This is especially important if you are taking diabetes medicines, blood thinners, or medicines to treat erectile dysfunction.  If you are told, drink enough fluid to keep your urine pale yellow. This will help  to flush the contrast dye out of your body. This information is not intended to replace advice given to you by your health care provider. Make sure you discuss any questions you have with your health care provider. Document Revised: 11/30/2018 Document Reviewed: 11/30/2018 Elsevier Patient Education  [employer].     Echocardiogram An echocardiogram is a procedure that uses painless sound waves (ultrasound) to produce an image of the heart. Images from an echocardiogram can provide important information about:  Signs of coronary artery disease (CAD).  Aneurysm detection. An aneurysm is a weak or damaged part of an artery wall that bulges out from the normal force of blood pumping through the body.  Heart size and shape. Changes in the size or shape of the heart can be associated with certain conditions, including heart failure, aneurysm, and CAD.  Heart muscle function.  Heart valve function.  Signs of a past heart attack.  Fluid buildup around the heart.  Thickening of the heart muscle.  A tumor or infectious growth around the heart valves. Tell a health care provider about:  Any allergies you have.  All medicines you are taking, including vitamins, herbs, eye drops, creams, and over-the-counter medicines.  Any blood disorders you have.  Any surgeries you have had.  Any medical conditions you have.  Whether you are pregnant or may be pregnant. What are the risks? Generally, this is a safe procedure. However, problems may occur, including:  Allergic reaction to dye (contrast) that may be used during the procedure. What happens before the procedure? No specific preparation is needed. You may eat and drink normally. What happens during the procedure?   An IV tube may be inserted into one of your veins.  You may receive contrast through this tube. A contrast is an injection that improves the quality of the pictures from your heart.  A gel will be applied to  your chest.  A wand-like tool (transducer) will be moved over your chest. The gel will help to transmit the sound waves from the transducer.  The sound waves will harmlessly bounce off of your heart to allow the heart images to be captured in real-time motion. The images will be recorded on a computer. The procedure may vary among health care providers and hospitals. What happens after the procedure?  You may return to your normal, everyday life, including diet, activities, and medicines, unless your health care provider tells you not to do that. Summary  An echocardiogram is a procedure that uses painless sound waves (ultrasound) to produce an image of the heart.  Images from an echocardiogram can provide important information about the size and shape of your heart, heart muscle function, heart valve  function, and fluid buildup around your heart.  You do not need to do anything to prepare before this procedure. You may eat and drink normally.  After the echocardiogram is completed, you may return to your normal, everyday life, unless your health care provider tells you not to do that. This information is not intended to replace advice given to you by your health care provider. Make sure you discuss any questions you have with your health care provider. Document Revised: 07/28/2018 Document Reviewed: 05/09/2016 Elsevier Patient Education  2020 [employer].  l    Signed, Kate Sable, MD  03/28/2020 12:53 PM    [place]

## 2020-03-28 NOTE — Patient Instructions (Signed)
Medication Instructions:  Your physician recommends that you continue on your current medications as directed. Please refer to the Current Medication list given to you today.  *If you need a refill on your cardiac medications before your next appointment, please call your pharmacy*   Lab Work: Your physician recommends that you return for lab work - within 1-2 weeks prior to CT once it is scheduled. - BMET.  - Please go to the [facility]. You will check in at the front desk to the right as you walk into the atrium. Valet Parking is offered if needed. - No appointment needed. You may go any day between 7 am and 6 pm.  If you have labs (blood work) drawn today and your tests are completely normal, you will receive your results only by: Marland Kitchen MyChart Message (if you have MyChart) OR . A paper copy in the mail If you have any lab test that is abnormal or we need to change your treatment, we will call you to review the results.   Testing/Procedures: 1- Your physician has requested that you have an echocardiogram. Echocardiography is a painless test that uses sound waves to create images of your heart. It provides your doctor with information about the size and shape of your heart and how well your heart's chambers and valves are working. This procedure takes approximately one hour. There are no restrictions for this procedure. There is a possibility that an IV may need to be started during your test to inject an image enhancing agent. This is done to obtain more optimal pictures of your heart. Therefore we ask that you do at least drink some water prior to coming in to hydrate your veins.   2- Coronary(Cardiac) CTA  Your cardiac CT will be scheduled at one of the below locations:   [facility] [address] [place], [place] [address] (336) [place] [address] [place], [place] [address] [phone]  If  scheduled at [facility], please arrive at the [facility] main entrance of [facility] 30 minutes prior to test start time. Proceed to the [facility] Radiology Department (first floor) to check-in and test prep.  If scheduled at [facility], please arrive 15 mins early for check-in and test prep.  Please follow these instructions carefully (unless otherwise directed):  Hold all erectile dysfunction medications at least 3 days (72 hrs) prior to test.  On the Night Before the Test: . Be sure to Drink plenty of water. . Do not consume any caffeinated/decaffeinated beverages or chocolate 12 hours prior to your test. . Do not take any antihistamines 12 hours prior to your test.  On the Day of the Test: . Drink plenty of water. Do not drink any water within one hour of the test. . Do not eat any food 4 hours prior to the test. . You may take your regular medications prior to the test.  . Take metoprolol (Lopressor) two hours prior to test. . HOLD Furosemide/Hydrochlorothiazide morning of the test.      After the Test: . Drink plenty of water. . After receiving IV contrast, you may experience a mild flushed feeling. This is normal. . On occasion, you may experience a mild rash up to 24 hours after the test. This is not dangerous. If this occurs, you can take Benadryl 25 mg and increase your fluid intake. . If you experience trouble breathing,  this can be serious. If it is severe call 911 IMMEDIATELY. If it is mild, please call our office. . If you take any of these medications: Glipizide/Metformin, Avandament, Glucavance, please do not take 48 hours after completing test unless otherwise instructed.   Once we have confirmed authorization from your insurance company, we will call you to set up a date and time for your test. Based on how quickly your insurance processes prior authorizations requests, please allow up to 4 weeks to be contacted for  scheduling your Cardiac CT appointment. Be advised that routine Cardiac CT appointments could be scheduled as many as 8 weeks after your provider has ordered it.  For non-scheduling related questions, please contact the cardiac imaging nurse navigator should you have any questions/concerns: Marchia Bond, Cardiac Imaging Nurse Navigator Burley Saver, Interim Cardiac Imaging Nurse [place] [facility] Direct Office Dial: [phone]   For scheduling needs, including cancellations and rescheduling, please call [place], [phone].     Follow-Up: At [facility], you and your health needs are our priority.  As part of our continuing mission to provide you with exceptional heart care, we have created designated Provider Care Teams.  These Care Teams include your primary Cardiologist (physician) and Advanced Practice Providers (APPs -  Physician Assistants and Nurse Practitioners) who all work together to provide you with the care you need, when you need it.  We recommend signing up for the patient portal called "MyChart".  Sign up information is provided on this After Visit Summary.  MyChart is used to connect with patients for Virtual Visits (Telemedicine).  Patients are able to view lab/test results, encounter notes, upcoming appointments, etc.  Non-urgent messages can be sent to your provider as well.   To learn more about what you can do with MyChart, go to [email or web address].    Your next appointment:   After testing approx. 6 weeks.   The format for your next appointment:   In Person  Provider:   You may see Kate Sable, MD or one of the following Advanced Practice Providers on your designated Care Team:    Murray Hodgkins, NP  Christell Faith, PA-C  Marrianne Mood, PA-C  Cadence Medina, [place]  Laurann Montana, NP     Cardiac CT Angiogram A cardiac CT angiogram is a procedure to look at the heart and the area around the heart. It may be  done to help find the cause of chest pains or other symptoms of heart disease. During this procedure, a substance called contrast dye is injected into the blood vessels in the area to be checked. A large X-ray machine, called a CT scanner, then takes detailed pictures of the heart and the surrounding area. The procedure is also sometimes called a coronary CT angiogram, coronary artery scanning, or CTA. A cardiac CT angiogram allows the health care provider to see how well blood is flowing to and from the heart. The health care provider will be able to see if there are any problems, such as:  Blockage or narrowing of the coronary arteries in the heart.  Fluid around the heart.  Signs of weakness or disease in the muscles, valves, and tissues of the heart. Tell a health care provider about:  Any allergies you have. This is especially important if you have had a previous allergic reaction to contrast dye.  All medicines you are taking, including vitamins, herbs, eye drops, creams, and over-the-counter medicines.  Any blood disorders you have.  Any surgeries  you have had.  Any medical conditions you have.  Whether you are pregnant or may be pregnant.  Any anxiety disorders, chronic pain, or other conditions you have that may increase your stress or prevent you from lying still. What are the risks? Generally, this is a safe procedure. However, problems may occur, including:  Bleeding.  Infection.  Allergic reactions to medicines or dyes.  Damage to other structures or organs.  Kidney damage from the contrast dye that is used.  Increased risk of cancer from radiation exposure. This risk is low. Talk with your health care provider about: ? The risks and benefits of testing. ? How you can receive the lowest dose of radiation. What happens before the procedure?  Wear comfortable clothing and remove any jewelry, glasses, dentures, and hearing aids.  Follow instructions from your  health care provider about eating and drinking. This may include: ? For 12 hours before the procedure - avoid caffeine. This includes tea, coffee, soda, energy drinks, and diet pills. Drink plenty of water or other fluids that do not have caffeine in them. Being well hydrated can prevent complications. ? For 4-6 hours before the procedure - stop eating and drinking. The contrast dye can cause nausea, but this is less likely if your stomach is empty.  Ask your health care provider about changing or stopping your regular medicines. This is especially important if you are taking diabetes medicines, blood thinners, or medicines to treat problems with erections (erectile dysfunction). What happens during the procedure?   Hair on your chest may need to be removed so that small sticky patches called electrodes can be placed on your chest. These will transmit information that helps to monitor your heart during the procedure.  An IV will be inserted into one of your veins.  You might be given a medicine to control your heart rate during the procedure. This will help to ensure that good images are obtained.  You will be asked to lie on an exam table. This table will slide in and out of the CT machine during the procedure.  Contrast dye will be injected into the IV. You might feel warm, or you may get a metallic taste in your mouth.  You will be given a medicine called nitroglycerin. This will relax or dilate the arteries in your heart.  The table that you are lying on will move into the CT machine tunnel for the scan.  The person running the machine will give you instructions while the scans are being done. You may be asked to: ? Keep your arms above your head. ? Hold your breath. ? Stay very still, even if the table is moving.  When the scanning is complete, you will be moved out of the machine.  The IV will be removed. The procedure may vary among health care providers and hospitals. What can  I expect after the procedure? After your procedure, it is common to have:  A metallic taste in your mouth from the contrast dye.  A feeling of warmth.  A headache from the nitroglycerin. Follow these instructions at home:  Take over-the-counter and prescription medicines only as told by your health care provider.  If you are told, drink enough fluid to keep your urine pale yellow. This will help to flush the contrast dye out of your body.  Most people can return to their normal activities right after the procedure. Ask your health care provider what activities are safe for you.  It is  up to you to get the results of your procedure. Ask your health care provider, or the department that is doing the procedure, when your results will be ready.  Keep all follow-up visits as told by your health care provider. This is important. Contact a health care provider if:  You have any symptoms of allergy to the contrast dye. These include: ? Shortness of breath. ? Rash or hives. ? A racing heartbeat. Summary  A cardiac CT angiogram is a procedure to look at the heart and the area around the heart. It may be done to help find the cause of chest pains or other symptoms of heart disease.  During this procedure, a large X-ray machine, called a CT scanner, takes detailed pictures of the heart and the surrounding area after a contrast dye has been injected into blood vessels in the area.  Ask your health care provider about changing or stopping your regular medicines before the procedure. This is especially important if you are taking diabetes medicines, blood thinners, or medicines to treat erectile dysfunction.  If you are told, drink enough fluid to keep your urine pale yellow. This will help to flush the contrast dye out of your body. This information is not intended to replace advice given to you by your health care provider. Make sure you discuss any questions you have with your health care  provider. Document Revised: 11/30/2018 Document Reviewed: 11/30/2018 Elsevier Patient Education  [place].     Echocardiogram An echocardiogram is a procedure that uses painless sound waves (ultrasound) to produce an image of the heart. Images from an echocardiogram can provide important information about:  Signs of coronary artery disease (CAD).  Aneurysm detection. An aneurysm is a weak or damaged part of an artery wall that bulges out from the normal force of blood pumping through the body.  Heart size and shape. Changes in the size or shape of the heart can be associated with certain conditions, including heart failure, aneurysm, and CAD.  Heart muscle function.  Heart valve function.  Signs of a past heart attack.  Fluid buildup around the heart.  Thickening of the heart muscle.  A tumor or infectious growth around the heart valves. Tell a health care provider about:  Any allergies you have.  All medicines you are taking, including vitamins, herbs, eye drops, creams, and over-the-counter medicines.  Any blood disorders you have.  Any surgeries you have had.  Any medical conditions you have.  Whether you are pregnant or may be pregnant. What are the risks? Generally, this is a safe procedure. However, problems may occur, including:  Allergic reaction to dye (contrast) that may be used during the procedure. What happens before the procedure? No specific preparation is needed. You may eat and drink normally. What happens during the procedure?   An IV tube may be inserted into one of your veins.  You may receive contrast through this tube. A contrast is an injection that improves the quality of the pictures from your heart.  A gel will be applied to your chest.  A wand-like tool (transducer) will be moved over your chest. The gel will help to transmit the sound waves from the transducer.  The sound waves will harmlessly bounce off of your heart to  allow the heart images to be captured in real-time motion. The images will be recorded on a computer. The procedure may vary among health care providers and hospitals. What happens after the procedure?  You may  return to your normal, everyday life, including diet, activities, and medicines, unless your health care provider tells you not to do that. Summary  An echocardiogram is a procedure that uses painless sound waves (ultrasound) to produce an image of the heart.  Images from an echocardiogram can provide important information about the size and shape of your heart, heart muscle function, heart valve function, and fluid buildup around your heart.  You do not need to do anything to prepare before this procedure. You may eat and drink normally.  After the echocardiogram is completed, you may return to your normal, everyday life, unless your health care provider tells you not to do that. This information is not intended to replace advice given to you by your health care provider. Make sure you discuss any questions you have with your health care provider. Document Revised: 07/28/2018 Document Reviewed: 05/09/2016 Elsevier Patient Education  [place].  l

## 2020-04-04 ENCOUNTER — Encounter: Payer: Self-pay | Admitting: Internal Medicine

## 2020-04-05 ENCOUNTER — Other Ambulatory Visit: Payer: Self-pay

## 2020-04-05 ENCOUNTER — Encounter: Payer: Self-pay | Admitting: Internal Medicine

## 2020-04-05 ENCOUNTER — Telehealth: Payer: Self-pay | Admitting: Internal Medicine

## 2020-04-05 ENCOUNTER — Other Ambulatory Visit: Payer: Self-pay | Admitting: Family

## 2020-04-05 ENCOUNTER — Other Ambulatory Visit: Payer: Managed Care, Other (non HMO)

## 2020-04-05 ENCOUNTER — Telehealth: Payer: Self-pay | Admitting: Family

## 2020-04-05 ENCOUNTER — Telehealth (INDEPENDENT_AMBULATORY_CARE_PROVIDER_SITE_OTHER): Payer: Managed Care, Other (non HMO) | Admitting: Internal Medicine

## 2020-04-05 VITALS — BP 148/92 | HR 81 | Ht 67.0 in | Wt 220.0 lb

## 2020-04-05 DIAGNOSIS — R059 Cough, unspecified: Secondary | ICD-10-CM

## 2020-04-05 DIAGNOSIS — J40 Bronchitis, not specified as acute or chronic: Secondary | ICD-10-CM | POA: Insufficient documentation

## 2020-04-05 DIAGNOSIS — J029 Acute pharyngitis, unspecified: Secondary | ICD-10-CM

## 2020-04-05 DIAGNOSIS — R748 Abnormal levels of other serum enzymes: Secondary | ICD-10-CM

## 2020-04-05 DIAGNOSIS — U071 COVID-19: Secondary | ICD-10-CM | POA: Diagnosis not present

## 2020-04-05 DIAGNOSIS — R0982 Postnasal drip: Secondary | ICD-10-CM | POA: Diagnosis not present

## 2020-04-05 DIAGNOSIS — R899 Unspecified abnormal finding in specimens from other organs, systems and tissues: Secondary | ICD-10-CM

## 2020-04-05 LAB — COMPREHENSIVE METABOLIC PANEL
ALT: 142 IU/L — ABNORMAL HIGH (ref 0–44)
AST: 99 IU/L — ABNORMAL HIGH (ref 0–40)
Albumin/Globulin Ratio: 2 (ref 1.2–2.2)
Albumin: 4.9 g/dL (ref 4.0–5.0)
Alkaline Phosphatase: 98 IU/L (ref 44–121)
BUN/Creatinine Ratio: 7 — ABNORMAL LOW (ref 9–20)
BUN: 8 mg/dL (ref 6–20)
Bilirubin Total: 0.3 mg/dL (ref 0.0–1.2)
CO2: 24 mmol/L (ref 20–29)
Calcium: 9.6 mg/dL (ref 8.7–10.2)
Chloride: 100 mmol/L (ref 96–106)
Creatinine, Ser: 1.08 mg/dL (ref 0.76–1.27)
GFR calc Af Amer: 100 mL/min/{1.73_m2} (ref >59)
GFR calc non Af Amer: 87 mL/min/{1.73_m2} (ref >59)
Globulin, Total: 2.5 g/dL (ref 1.5–4.5)
Glucose: 99 mg/dL (ref 65–99)
Potassium: 4 mmol/L (ref 3.5–5.2)
Sodium: 137 mmol/L (ref 134–144)
Total Protein: 7.4 g/dL (ref 6.0–8.5)

## 2020-04-05 LAB — HEPATITIS C ANTIBODY: Hep C Virus Ab: <0.1 s/co ratio (ref 0.0–0.9)

## 2020-04-05 LAB — HEMOGLOBIN A1C
Est. average glucose Bld gHb Est-mCnc: 103 mg/dL
Hgb A1c MFr Bld: 5.2 % (ref 4.8–5.6)

## 2020-04-05 MED ORDER — HYDROCOD POLST-CPM POLST ER 10-8 MG/5ML PO SUER: 5.0000 mL | Freq: Every evening | ORAL | 0 refills | Status: DC | PRN

## 2020-04-05 MED ORDER — FLUTICASONE PROPIONATE 50 MCG/ACT NA SUSP: 2.0000 | Freq: Every day | NASAL | 11 refills | Status: DC

## 2020-04-05 MED ORDER — ALBUTEROL SULFATE HFA 108 (90 BASE) MCG/ACT IN AERS: 1.0000 | INHALATION_SPRAY | Freq: Four times a day (QID) | RESPIRATORY_TRACT | 0 refills | Status: DC | PRN

## 2020-04-05 MED ORDER — AZITHROMYCIN 250 MG PO TABS: ORAL_TABLET | ORAL | 0 refills | Status: DC

## 2020-04-05 MED ORDER — PREDNISONE 20 MG PO TABS: 40.0000 mg | ORAL_TABLET | Freq: Every day | ORAL | 0 refills | Status: DC

## 2020-04-05 NOTE — Telephone Encounter (Signed)
Left message to return call. Attempting to start 10:00am video visit

## 2020-04-05 NOTE — Telephone Encounter (Signed)
Error

## 2020-04-05 NOTE — Addendum Note (Signed)
Addended by: Warden Fillers on: 04/05/2020 11:10 AM   Modules accepted: Orders

## 2020-04-05 NOTE — Telephone Encounter (Signed)
Patient calling back in and appointment started

## 2020-04-05 NOTE — Progress Notes (Signed)
Telephone Note  I connected with Jack Lewis  on 04/05/20 at 10:40 AM EST by telephone and verified that I am speaking with the correct person using two identifiers.  Location patient: home, [place] Location provider:work or home office Persons participating in the virtual visit: patient, provider  I discussed the limitations of evaluation and management by telemedicine and the availability of in person appointments. The patient expressed understanding and agreed to proceed.   HPI: S/p covid 01/2020 CT chest negative and CXR neg 02/21/20 Denies fever but having sinus issues pressure comes and goes worse at night and worse since covid + 01/2020 runny nose, sob with exertion, dry to productive cough tried mucinex d and otc allergy meds and nyquil w/o help. No h/o asthma or alcohol has seasonal allergyes. Sx's worse 1 week ago on Monday with sinus issues, PND, sore throat 2 kids 3 y.o also sick with cough runny nose.  He also had reduced smell since covid   He has also tried netti   Elevated lfts ordered US f/u with PCP repeat labs ordered   ROS: See pertinent positives and negatives per HPI.  Past Medical History:  Diagnosis Date  . Allergy   . Hypertension   . Vertigo     Past Surgical History:  Procedure Laterality Date  . APPENDECTOMY    . VASECTOMY    . VASECTOMY REVERSAL       Current Outpatient Medications:  .  lisinopril (ZESTRIL) 5 MG tablet, Take 1 tablet (5 mg total) by mouth daily., Disp: 90 tablet, Rfl: 1 .  loratadine (CLARITIN) 10 MG tablet, Take 10 mg by mouth daily., Disp: , Rfl:  .  traZODone (DESYREL) 50 MG tablet, Take 0.5-1 tablets (25-50 mg total) by mouth at bedtime as needed for sleep., Disp: 30 tablet, Rfl: 3 .  albuterol (VENTOLIN HFA) 108 (90 Base) MCG/ACT inhaler, Inhale 1-2 puffs into the lungs every 6 (six) hours as needed for wheezing or shortness of breath., Disp: 18 g, Rfl: 0 .  azithromycin (ZITHROMAX) 250 MG tablet, 2 pills day 1 and 1 pill day 2-5,  Disp: 6 tablet, Rfl: 0 .  chlorpheniramine-HYDROcodone (TUSSIONEX PENNKINETIC ER) 10-8 MG/5ML SUER, Take 5 mLs by mouth at bedtime as needed for cough., Disp: 115 mL, Rfl: 0 .  diclofenac Sodium (VOLTAREN) 1 % GEL, Apply 4 g topically 4 (four) times daily. (Patient not taking: Reported on 04/04/2020), Disp: 50 g, Rfl: 3 .  fluticasone (FLONASE) 50 MCG/ACT nasal spray, Place 2 sprays into both nostrils daily. Prn, Disp: 16 g, Rfl: 11 .  predniSONE (DELTASONE) 20 MG tablet, Take 2 tablets (40 mg total) by mouth daily with breakfast. X 5-7 days, Disp: 14 tablet, Rfl: 0  EXAM:  VITALS per patient if applicable:  GENERAL: alert, oriented, appears well and in no acute distress  MS: moves all visible extremities without noticeable abnormality  PSYCH/NEURO: pleasant and cooperative, no obvious depression or anxiety, speech and thought processing grossly intact  ASSESSMENT AND PLAN:  Discussed the following assessment and plan:  Bronchitis - Plan: chlorpheniramine-HYDROcodone (TUSSIONEX PENNKINETIC ER) 10-8 MG/5ML SUER, albuterol (VENTOLIN HFA) 108 (90 Base) MCG/ACT inhaler, predniSONE (DELTASONE) 20 MG tablet, azithromycin (ZITHROMAX) 250 MG tablet, COVID-19, Flu A+B and RSV  COVID-19 virus infection had 01/2020 - Plan: COVID-19, Flu A+B and RSV  Cough - Plan: COVID-19, Flu A+B and RSV  PND (post-nasal drip) - Plan: fluticasone (FLONASE) 50 MCG/ACT nasal spray, COVID-19, Flu A+B and RSV  Sore throat - Plan: COVID-19, Flu  A+B and RSV  Elevated liver enzymes - Plan: US Abdomen Complete cmet ordered and sch  F/u with PCP   -we discussed possible serious and likely etiologies, options for evaluation and workup, limitations of telemedicine visit vs in person visit, treatment, treatment risks and precautions.   I discussed the assessment and treatment plan with the patient. The patient was provided an opportunity to ask questions and all were answered. The patient agreed with the plan and  demonstrated an understanding of the instructions.    Time spent 20 min Bevelyn Buckles, MD

## 2020-04-05 NOTE — Progress Notes (Signed)
Onset of symptoms 2 weeks before halloween. Patient was on antibiotics a few weeks ago and symptoms never got better.   Has been taking Mucinex DM. Having congestion, drainage, and cough.  Patient did at home test for Covid and was negative. Patient had Covid the week before halloween.   Patient has 2 three year olds. They just got sick this week.   Patient has a history of allergies.

## 2020-04-11 ENCOUNTER — Encounter: Payer: Self-pay | Admitting: Family

## 2020-04-11 ENCOUNTER — Other Ambulatory Visit: Payer: Self-pay

## 2020-04-11 ENCOUNTER — Other Ambulatory Visit (INDEPENDENT_AMBULATORY_CARE_PROVIDER_SITE_OTHER): Payer: Managed Care, Other (non HMO)

## 2020-04-11 DIAGNOSIS — R899 Unspecified abnormal finding in specimens from other organs, systems and tissues: Secondary | ICD-10-CM

## 2020-04-11 DIAGNOSIS — R7989 Other specified abnormal findings of blood chemistry: Secondary | ICD-10-CM

## 2020-04-11 LAB — COMPREHENSIVE METABOLIC PANEL
ALT: 44 U/L (ref 0–53)
AST: 18 U/L (ref 0–37)
Albumin: 4.6 g/dL (ref 3.5–5.2)
Alkaline Phosphatase: 68 U/L (ref 39–117)
BUN: 14 mg/dL (ref 6–23)
CO2: 28 mEq/L (ref 19–32)
Calcium: 10.3 mg/dL (ref 8.4–10.5)
Chloride: 102 mEq/L (ref 96–112)
Creatinine, Ser: 1.01 mg/dL (ref 0.40–1.50)
GFR: 94.61 mL/min (ref >60.00)
Glucose, Bld: 109 mg/dL — ABNORMAL HIGH (ref 70–99)
Potassium: 4.3 mEq/L (ref 3.5–5.1)
Sodium: 136 mEq/L (ref 135–145)
Total Bilirubin: 0.4 mg/dL (ref 0.2–1.2)
Total Protein: 7.5 g/dL (ref 6.0–8.3)

## 2020-04-15 ENCOUNTER — Encounter: Payer: Self-pay | Admitting: Family

## 2020-04-15 LAB — TESTOSTERONE,FREE AND TOTAL
Testosterone, Free: 4.5 pg/mL — ABNORMAL LOW (ref 8.7–25.1)
Testosterone: 320 ng/dL (ref 264–916)

## 2020-04-16 ENCOUNTER — Telehealth (HOSPITAL_COMMUNITY): Payer: Self-pay | Admitting: *Deleted

## 2020-04-16 NOTE — Telephone Encounter (Signed)
Attempted to call patient regarding upcoming cardiac CT appointment. Left message with sister to call back.  Burley Saver RN Navigator Cardiac Imaging [facility] [phone] Office [phone] Cell

## 2020-04-17 ENCOUNTER — Other Ambulatory Visit: Payer: Self-pay | Admitting: Family

## 2020-04-17 DIAGNOSIS — R7989 Other specified abnormal findings of blood chemistry: Secondary | ICD-10-CM

## 2020-04-18 ENCOUNTER — Ambulatory Visit: Admission: RE | Admit: 2020-04-18 | Payer: Managed Care, Other (non HMO) | Source: Ambulatory Visit

## 2020-04-29 NOTE — Progress Notes (Deleted)
Subjective:    Patient ID: Jack Lewis, male    DOB: [birth year], 40 y.o.   MRN: [id number]  CC: Jack Lewis is a 40 y.o. male who presents today for follow up.   HPI: Discuss RUQ [place]    HISTORY:  Past Medical History:  Diagnosis Date  . Allergy   . Hypertension   . Vertigo    Past Surgical History:  Procedure Laterality Date  . APPENDECTOMY    . VASECTOMY    . VASECTOMY REVERSAL     Family History  Problem Relation Age of Onset  . Hypertension Mother   . Diabetes Father   . Hypertension Father   . Heart attack Father 75  . Heart attack Paternal Grandfather 15    Allergies: Patient has no known allergies. Current Outpatient Medications on File Prior to Visit  Medication Sig Dispense Refill  . albuterol (VENTOLIN HFA) 108 (90 Base) MCG/ACT inhaler Inhale 1-2 puffs into the lungs every 6 (six) hours as needed for wheezing or shortness of breath. 18 g 0  . azithromycin (ZITHROMAX) 250 MG tablet 2 pills day 1 and 1 pill day 2-5 6 tablet 0  . chlorpheniramine-HYDROcodone (TUSSIONEX PENNKINETIC ER) 10-8 MG/5ML SUER Take 5 mLs by mouth at bedtime as needed for cough. 115 mL 0  . diclofenac Sodium (VOLTAREN) 1 % GEL Apply 4 g topically 4 (four) times daily. (Patient not taking: Reported on 04/04/2020) 50 g 3  . fluticasone (FLONASE) 50 MCG/ACT nasal spray Place 2 sprays into both nostrils daily. Prn 16 g 11  . lisinopril (ZESTRIL) 5 MG tablet Take 1 tablet (5 mg total) by mouth daily. 90 tablet 1  . loratadine (CLARITIN) 10 MG tablet Take 10 mg by mouth daily.    . predniSONE (DELTASONE) 20 MG tablet Take 2 tablets (40 mg total) by mouth daily with breakfast. X 5-7 days 14 tablet 0  . traZODone (DESYREL) 50 MG tablet Take 0.5-1 tablets (25-50 mg total) by mouth at bedtime as needed for sleep. 30 tablet 3   No current facility-administered medications on file prior to visit.    Social History   Tobacco Use  . Smoking status: Never Smoker  . Smokeless tobacco: Never  Used  Vaping Use  . Vaping Use: Never used  Substance Use Topics  . Alcohol use: Yes    Alcohol/week: 0.0 standard drinks    Comment: occasional  . Drug use: No    Review of Systems    Objective:    There were no vitals taken for this visit. BP Readings from Last 3 Encounters:  04/05/20 (!) 148/92  03/28/20 120/80  03/06/20 118/80   Wt Readings from Last 3 Encounters:  04/05/20 220 lb (99.8 kg)  03/28/20 231 lb (104.8 kg)  03/06/20 225 lb (102.1 kg)    Physical Exam     Assessment & Plan:   Problem List Items Addressed This Visit   None      I am having Jack Lewis maintain his loratadine, traZODone, lisinopril, diclofenac Sodium, chlorpheniramine-HYDROcodone, fluticasone, albuterol, predniSONE, and azithromycin.   No orders of the defined types were placed in this encounter.   Return precautions given.   Risks, benefits, and alternatives of the medications and treatment plan prescribed today were discussed, and patient expressed understanding.   Education regarding symptom management and diagnosis given to patient on AVS.  Continue to follow with [name], FNP for routine health maintenance.   Jack Lewis and I  agreed with plan.   [name], FNP

## 2020-04-30 ENCOUNTER — Ambulatory Visit (INDEPENDENT_AMBULATORY_CARE_PROVIDER_SITE_OTHER): Payer: Managed Care, Other (non HMO)

## 2020-04-30 ENCOUNTER — Other Ambulatory Visit: Payer: Self-pay

## 2020-04-30 DIAGNOSIS — R079 Chest pain, unspecified: Secondary | ICD-10-CM | POA: Diagnosis not present

## 2020-04-30 LAB — ECHOCARDIOGRAM COMPLETE
Area-P 1/2: 4.49 cm2
Calc EF: 66.5 %
S' Lateral: 3.1 cm
Single Plane A2C EF: 53.4 %
Single Plane A4C EF: 75.7 %

## 2020-05-01 ENCOUNTER — Telehealth (HOSPITAL_COMMUNITY): Payer: Self-pay | Admitting: Emergency Medicine

## 2020-05-01 NOTE — Telephone Encounter (Signed)
Reaching out to patient to offer assistance regarding upcoming cardiac imaging study; pt verbalizes understanding of appt date/time, parking situation and where to check in, pre-test NPO status and medications ordered, and verified current allergies; name and call back number provided for further questions should they arise Rockwell Alexandria RN Navigator Cardiac Imaging [facility] [phone] office [phone] cell  Holding allergy meds and BP med night prior. Taking 100mg  metop 2h PTA 

## 2020-05-02 ENCOUNTER — Other Ambulatory Visit: Payer: Self-pay

## 2020-05-02 ENCOUNTER — Ambulatory Visit (INDEPENDENT_AMBULATORY_CARE_PROVIDER_SITE_OTHER): Payer: Managed Care, Other (non HMO) | Admitting: Urology

## 2020-05-02 ENCOUNTER — Ambulatory Visit
Admission: RE | Admit: 2020-05-02 | Discharge: 2020-05-02 | Disposition: A | Discharge status: Home / Self Care | Payer: Managed Care, Other (non HMO) | Source: Ambulatory Visit | Attending: Cardiology | Admitting: Cardiology

## 2020-05-02 ENCOUNTER — Encounter: Payer: Self-pay | Admitting: Urology

## 2020-05-02 VITALS — BP 145/75 | HR 62 | Ht 67.0 in | Wt 225.0 lb

## 2020-05-02 DIAGNOSIS — E291 Testicular hypofunction: Secondary | ICD-10-CM | POA: Diagnosis not present

## 2020-05-02 DIAGNOSIS — R079 Chest pain, unspecified: Secondary | ICD-10-CM

## 2020-05-02 MED ORDER — IOHEXOL 350 MG/ML SOLN
100.0000 mL | Freq: Once | INTRAVENOUS | Status: AC | PRN
  Administered 2020-05-02: 90 mL via INTRAVENOUS

## 2020-05-02 MED ORDER — NITROGLYCERIN 0.4 MG SL SUBL
0.8000 mg | SUBLINGUAL_TABLET | Freq: Once | SUBLINGUAL | Status: AC
  Administered 2020-05-02: 0.8 mg via SUBLINGUAL

## 2020-05-02 NOTE — Progress Notes (Signed)
05/02/2020 8:33 AM   [name] 01/07/1981 [id number]  Referring provider: [name], FNP [address] [place],  [place] [address]  Chief Complaint  Patient presents with  . Hypogonadism    HPI: Jack Lewis is a 40 y.o. referred for evaluation of hypogonadism.   States he was diagnosed with hypogonadism in 2008 and took AndroGel for approximately 6 months  He did not like this delivery method and was having other health issues and discontinued the medication  Complains of increased weight gain, decreased energy and decreased libido  Had a vasectomy in 2007 and reversal in 2012 and was placed on Clomid for low sperm counts.  He did notice improving energy and libido while on Clomid but never had a testosterone level rechecked  A total testosterone level 03/06/2020 was normal at 449 with a free testosterone low normal at 45.3  Repeat testosterone 12/23 was 320 with a low free testosterone at 4.5 pg/mL   PMH: Past Medical History:  Diagnosis Date  . Allergy   . Hypertension   . Vertigo     Surgical History: Past Surgical History:  Procedure Laterality Date  . APPENDECTOMY    . VASECTOMY    . VASECTOMY REVERSAL      Home Medications:  Allergies as of 05/02/2020   No Known Allergies     Medication List       Accurate as of May 02, 2020  8:33 AM. If you have any questions, ask your nurse or doctor.        STOP taking these medications   azithromycin 250 MG tablet Commonly known as: ZITHROMAX Stopped by: Riki Altes, MD   predniSONE 20 MG tablet Commonly known as: DELTASONE Stopped by: Riki Altes, MD     TAKE these medications   albuterol 108 (90 Base) MCG/ACT inhaler Commonly known as: VENTOLIN HFA Inhale 1-2 puffs into the lungs every 6 (six) hours as needed for wheezing or shortness of breath.   chlorpheniramine-HYDROcodone 10-8 MG/5ML Suer Commonly known as: Tussionex Pennkinetic ER Take 5 mLs by mouth at  bedtime as needed for cough.   diclofenac Sodium 1 % Gel Commonly known as: VOLTAREN Apply 4 g topically 4 (four) times daily.   fluticasone 50 MCG/ACT nasal spray Commonly known as: FLONASE Place 2 sprays into both nostrils daily. Prn   lisinopril 5 MG tablet Commonly known as: ZESTRIL Take 1 tablet (5 mg total) by mouth daily.   loratadine 10 MG tablet Commonly known as: CLARITIN Take 10 mg by mouth daily.   traZODone 50 MG tablet Commonly known as: DESYREL Take 0.5-1 tablets (25-50 mg total) by mouth at bedtime as needed for sleep.       Allergies: No Known Allergies  Family History: Family History  Problem Relation Age of Onset  . Hypertension Mother   . Diabetes Father   . Hypertension Father   . Heart attack Father 79  . Heart attack Paternal Grandfather 34    Social History:  reports that he has never smoked. He has never used smokeless tobacco. He reports current alcohol use. He reports that he does not use drugs.   Physical Exam: BP (!) 145/75   Pulse 62   Ht 5\' 7"  (1.702 m)   Wt 225 lb (102.1 kg)   BMI 35.24 kg/m   Constitutional:  Alert and oriented, No acute distress. HEENT: [place] AT, moist mucus membranes.  Trachea midline, no masses. Cardiovascular: No clubbing, cyanosis, or  edema. Respiratory: Normal respiratory effort, no increased work of breathing. GU: Phallus without lesions, testes descended bilaterally without masses or tenderness.  Estimated volume 20 cc bilaterally Skin: No rashes, bruises or suspicious lesions. Neurologic: Grossly intact, no focal deficits, moving all 4 extremities. Psychiatric: Normal mood and affect.   Assessment & Plan:    1.  Hypogonadism  He has symptoms of hypogonadism and 1 low free testosterone level.  We discussed that insurance companies require 2 low levels prior to testosterone coverage.  Repeat total testosterone with testosterone free profile, LH and prolactin  He does not desire topical testosterone.   We discussed intramuscular injections and Xyosted along with recent approval of oral testosterone though the insurance coverage for this medication is lacking.  He is interested in [place] and will send Rx if his repeat levels are abnormal.  We also discussed the off label use of Clomid  Potential side effects of testosterone replacement were discussed including stimulation of benign prostatic growth with lower urinary tract symptoms; erythrocytosis; edema; gynecomastia; worsening sleep apnea; venous thromboembolism; testicular atrophy and infertility. Recent studies suggesting an increased incidence of heart attack and stroke in patients taking testosterone was discussed. He was informed there is conflicting evidence regarding the impact of testosterone therapy on cardiovascular risk. The theoretical risk of growth stimulation of an undetected prostate cancer was also discussed.  He was informed that current evidence does not provide any definitive answers regarding the risks of testosterone therapy on prostate cancer and cardiovascular disease. The need for periodic monitoring of his testosterone level, PSA, hematocrit and DRE was discussed.   Riki Altes, MD  [facility] [address], [address] [place], [place] [address] [phone]

## 2020-05-02 NOTE — Progress Notes (Signed)
Patient tolerated CT well. Drank water and ate peanut butter crackers after. Vital signs stable encourage to drink water throughout day.Reasons explained and verbalized understanding. Ambulated steady gait.  

## 2020-05-03 ENCOUNTER — Telehealth: Payer: Self-pay

## 2020-05-03 LAB — PROLACTIN: Prolactin: 11.5 ng/mL (ref 4.0–15.2)

## 2020-05-03 LAB — LUTEINIZING HORMONE: LH: 3.7 m[IU]/mL (ref 1.7–8.6)

## 2020-05-03 LAB — TESTOSTERONE FREE, PROFILE I
Albumin: 4.7 g/dL (ref 4.0–5.0)
Sex Hormone Binding: 41.8 nmol/L (ref 16.5–55.9)
Testost., Free, Calc: 76.7 pg/mL (ref 42.3–190.0)
Testosterone: 449 ng/dL (ref 264–916)

## 2020-05-03 NOTE — Telephone Encounter (Signed)
-----   Message from Debbe Odea, MD sent at 05/02/2020  6:11 PM EST ----- Normal coronary CTA, no evidence for CAD.

## 2020-05-03 NOTE — Telephone Encounter (Signed)
Left message with sister requesting to have patient call back for CT results. She states his son is in surgery so all of his calls have been forwarded to her but she will relay the message to him.

## 2020-05-06 ENCOUNTER — Telehealth (INDEPENDENT_AMBULATORY_CARE_PROVIDER_SITE_OTHER): Payer: Managed Care, Other (non HMO) | Admitting: Family

## 2020-05-06 ENCOUNTER — Ambulatory Visit: Payer: Managed Care, Other (non HMO) | Admitting: Family

## 2020-05-06 ENCOUNTER — Encounter: Payer: Self-pay | Admitting: Family

## 2020-05-06 VITALS — Ht 67.0 in | Wt 222.0 lb

## 2020-05-06 DIAGNOSIS — R0600 Dyspnea, unspecified: Secondary | ICD-10-CM

## 2020-05-06 DIAGNOSIS — I10 Essential (primary) hypertension: Secondary | ICD-10-CM | POA: Diagnosis not present

## 2020-05-06 DIAGNOSIS — R0609 Other forms of dyspnea: Secondary | ICD-10-CM

## 2020-05-06 DIAGNOSIS — R079 Chest pain, unspecified: Secondary | ICD-10-CM | POA: Diagnosis not present

## 2020-05-06 MED ORDER — BUDESONIDE-FORMOTEROL FUMARATE 80-4.5 MCG/ACT IN AERO: 2.0000 | INHALATION_SPRAY | Freq: Two times a day (BID) | RESPIRATORY_TRACT | 3 refills | Status: DC

## 2020-05-06 NOTE — Assessment & Plan Note (Signed)
Stable. Contine lisinopril 5mg .

## 2020-05-06 NOTE — Assessment & Plan Note (Signed)
Unchanged. Onset after COVID 19. Suspect allergies also playing a role. Trial of symbicort QD and albuterol prior to work out. Discussed trial of singulair. Agreed that formal evaluation by pulmonology for DOE and possible OSA appropriate. Referral placed.

## 2020-05-06 NOTE — Assessment & Plan Note (Signed)
Improved. Discussed possible GERD. Advised to use pepcid ac or Tums as needed.

## 2020-05-06 NOTE — Progress Notes (Signed)
Virtual Visit via Video Note  I connected with@  on 05/06/20 at 10:00 AM EST by a video enabled telemedicine application and verified that I am speaking with the correct person using two identifiers.  Location patient: home Location provider:work  Persons participating in the virtual visit: patient, provider  I discussed the limitations of evaluation and management by telemedicine and the availability of in person appointments. The patient expressed understanding and agreed to proceed.   HPI: Most bothered by DOE.   Working on increasing cardio work outs for past 6 months and finds that he gets short of breath with activity. Working out 6 days per week, 1.5 hours per visit. SOB more noticeable since he had COVID, 13 weeks ago.    Notices when wheeling trashcan out to street and back ,he feels winded, distance approx 200 feet. When he is doing squats at gym, he will need to rest in between. No wheezing, syncope, dizziness, leg swelling.   Has seen ENT last month and told that he was allergic to cats ( lives with a cat), trees. He restarted flonase BID and using albuterol in the morning and in at night without improvement in DOE. Improvement of cough, congestion since zpak 6 weeks ago.   Never smoker.   Never had RUQ [place]  CP with forward bending occurs 'every now and then.' He is now noticing it is related to eating, such as after a milkshake with associated epigastric burning. No trouble or pain with swallowing.   HTN- compliant with lisinopril 5mg .    Result evaluation with dr for chest pain. Normal CTA, no evidence of CAD. Echo showed normal systolic and diastolic function.  Sleep studies in the past have been negative. 10 years ago.  No HA. Snores. No fatigue.   Ct ab 2008 - normal liver    ROS: See pertinent positives and negatives per HPI.    EXAM:  VITALS per patient if applicable: Ht 5\' 7"  (1.702 m)   Wt 222 lb (100.7 kg)   BMI 34.77 kg/m  BP Readings from  Last 3 Encounters:  05/02/20 (!) 111/52  05/02/20 (!) 145/75  04/05/20 (!) 148/92   Wt Readings from Last 3 Encounters:  05/06/20 222 lb (100.7 kg)  05/02/20 225 lb (102.1 kg)  04/05/20 220 lb (99.8 kg)    GENERAL: alert, oriented, appears well and in no acute distress  HEENT: atraumatic, conjunttiva clear, no obvious abnormalities on inspection of external nose and ears  NECK: normal movements of the head and neck  LUNGS: on inspection no signs of respiratory distress, breathing rate appears normal, no obvious gross SOB, gasping or wheezing  CV: no obvious cyanosis  MS: moves all visible extremities without noticeable abnormality  PSYCH/NEURO: pleasant and cooperative, no obvious depression or anxiety, speech and thought processing grossly intact  ASSESSMENT AND PLAN:  Discussed the following assessment and plan:  Problem List Items Addressed This Visit      Cardiovascular and Mediastinum   Hypertension    Stable. Contine lisinopril 5mg .         Other   Chest pain    Improved. Discussed possible GERD. Advised to use pepcid ac or Tums as needed.       DOE (dyspnea on exertion) - Primary    Unchanged. Onset after COVID 19. Suspect allergies also playing a role. Trial of symbicort QD and albuterol prior to work out. Discussed trial of singulair. Agreed that formal evaluation by pulmonology for DOE and possible OSA  appropriate. Referral placed.       Relevant Medications   budesonide-formoterol (SYMBICORT) 80-4.5 MCG/ACT inhaler   Other Relevant Orders   Ambulatory referral to Pulmonology      -we discussed possible serious and likely etiologies, options for evaluation and workup, limitations of telemedicine visit vs in person visit, treatment, treatment risks and precautions. Pt prefers to treat via telemedicine empirically rather then risking or undertaking an in person visit at this moment.  .   I discussed the assessment and treatment plan with the patient.  The patient was provided an opportunity to ask questions and all were answered. The patient agreed with the plan and demonstrated an understanding of the instructions.   The patient was advised to call back or seek an in-person evaluation if the symptoms worsen or if the condition fails to improve as anticipated.   [name], FNP

## 2020-05-07 ENCOUNTER — Telehealth: Payer: Self-pay | Admitting: *Deleted

## 2020-05-07 NOTE — Telephone Encounter (Signed)
Total testosterone level normal at 449 and free testosterone level normal at 76.7. Based on those levels will be unable to start testosterone. Can repeat levels in approximately 1 month. Could also get a second opinion with endocrinology. Per stoioff  Notified patient as instructed, patient will come in one month for lab appt.

## 2020-05-13 ENCOUNTER — Ambulatory Visit: Payer: Managed Care, Other (non HMO) | Admitting: Cardiology

## 2020-05-13 ENCOUNTER — Encounter: Payer: Self-pay | Admitting: Cardiology

## 2020-05-13 ENCOUNTER — Other Ambulatory Visit: Payer: Self-pay

## 2020-05-13 VITALS — BP 110/80 | HR 65 | Ht 67.0 in | Wt 226.0 lb

## 2020-05-13 DIAGNOSIS — E7800 Pure hypercholesterolemia, unspecified: Secondary | ICD-10-CM

## 2020-05-13 DIAGNOSIS — I10 Essential (primary) hypertension: Secondary | ICD-10-CM | POA: Diagnosis not present

## 2020-05-13 DIAGNOSIS — R072 Precordial pain: Secondary | ICD-10-CM

## 2020-05-13 NOTE — Patient Instructions (Signed)

## 2020-05-13 NOTE — Progress Notes (Signed)
Cardiology Office Note:    Date:  05/13/2020   ID:  Jack Lewis, DOB 1980-07-20, MRN [id number]  PCP:  [name], FNP  [facility] Cardiologist:  Debbe Odea, MD  [facility] Electrophysiologist:  None   Referring MD: [name], FNP   Chief Complaint  Patient presents with  . F/U after cardiac testing    History of Present Illness:    Jack Lewis is a 40 y.o. male with a hx of hypertension, hyperlipidemia who presents for follow-up.  Last seen due to chest pain lasting several weeks.  Due to history and risk factors, echo and coronary CTA was performed.  Blood pressures for results.  He thinks his symptoms might of alcoholic due to reflux, as symptoms were reproducible not so long ago after drinking a milkshake.  He otherwise feels okay, has no concerns at this time.    Past Medical History:  Diagnosis Date  . Allergy   . Hypertension   . Vertigo     Past Surgical History:  Procedure Laterality Date  . APPENDECTOMY    . VASECTOMY    . VASECTOMY REVERSAL      Current Medications: Current Meds  Medication Sig  . albuterol (VENTOLIN HFA) 108 (90 Base) MCG/ACT inhaler Inhale 1-2 puffs into the lungs every 6 (six) hours as needed for wheezing or shortness of breath.  . budesonide-formoterol (SYMBICORT) 80-4.5 MCG/ACT inhaler Inhale 2 puffs into the lungs 2 (two) times daily.  . fluticasone (FLONASE) 50 MCG/ACT nasal spray Place 2 sprays into both nostrils daily. Prn  . lisinopril (ZESTRIL) 5 MG tablet Take 1 tablet (5 mg total) by mouth daily.  Marland Kitchen loratadine (CLARITIN) 10 MG tablet Take 10 mg by mouth daily.  . traZODone (DESYREL) 50 MG tablet Take 0.5-1 tablets (25-50 mg total) by mouth at bedtime as needed for sleep.     Allergies:   Patient has no known allergies.   Social History   Socioeconomic History  . Marital status: Married    Spouse name: Not on file  . Number of children: 3  . Years of education: Not on file  . Highest  education level: Not on file  Occupational History  . Not on file  Tobacco Use  . Smoking status: Never Smoker  . Smokeless tobacco: Never Used  Vaping Use  . Vaping Use: Never used  Substance and Sexual Activity  . Alcohol use: Not Currently    Alcohol/week: 0.0 standard drinks    Comment: occasional  . Drug use: No  . Sexual activity: Yes    Birth control/protection: Surgical  Other Topics Concern  . Not on file  Social History Narrative   Married with 3 boys.    Works in [employer], more office job   Social Determinants of Corporate investment banker Strain: Not on [employer] Insecurity: Not on file  Transportation Needs: Not on file  Physical Activity: Not on file  Stress: Not on file  Social Connections: Not on file     Family History: The patient's family history includes Diabetes in his father; Heart attack (age of onset: 50) in his father; Heart attack (age of onset: 18) in his paternal grandfather; Hypertension in his father and mother.  ROS:   Please see the history of present illness.     All other systems reviewed and are negative.  EKGs/Labs/Other Studies Reviewed:    The following studies were reviewed today:   EKG:  EKG not  ordered today.  Recent Labs: 02/20/2020: Hemoglobin 14.3; Platelets 249 03/06/2020: Magnesium 2.0; TSH 1.31 04/11/2020: ALT 44; BUN 14; Creatinine, Ser 1.01; Potassium 4.3; Sodium 136  Recent Lipid Panel    Component Value Date/Time   CHOL 214 (H) 02/02/2020 1145   TRIG 52 02/02/2020 1145   HDL 58 02/02/2020 1145   CHOLHDL 3.7 02/02/2020 1145   LDLCALC 147 (H) 02/02/2020 1145     Risk Assessment/Calculations:      Physical Exam:    VS:  BP 110/80 (BP Location: Left Arm, Patient Position: Sitting, Cuff Size: Large)   Pulse 65   Ht 5\' 7"  (1.702 m)   Wt 226 lb (102.5 kg)   SpO2 98%   BMI 35.40 kg/m     Wt Readings from Last 3 Encounters:  05/13/20 226 lb (102.5 kg)  05/06/20 222 lb (100.7 kg)  05/02/20  225 lb (102.1 kg)     GEN:  Well nourished, well developed in no acute distress HEENT: Normal NECK: No JVD; No carotid bruits LYMPHATICS: No lymphadenopathy CARDIAC: RRR, no murmurs, rubs, gallops RESPIRATORY:  Clear to auscultation without rales, wheezing or rhonchi  ABDOMEN: Soft, non-tender, non-distended MUSCULOSKELETAL:  No edema; No deformity  SKIN: Warm and dry NEUROLOGIC:  Alert and oriented x 3 PSYCHIATRIC:  Normal affect   ASSESSMENT:    1. Precordial pain   2. Primary hypertension   3. Pure hypercholesterolemia    PLAN:    In order of problems listed above:  1. Chest pain, risk factors hypertension hyperlipidemia, echocardiogram showed normal systolic and diastolic function, EF 60 to 65%.  Coronary CTA cardiac calcium score was 0, no evidence of CAD.  Patient is low risk for coronary events.  Patient made aware of results and reassured. 2. Hypertension, BP control, continue lisinopril 3. Hyperlipidemia, low-cholesterol diet advised.  Follow-up as needed.   Shared Decision Making/Informed Consent       Medication Adjustments/Labs and Tests Ordered: Current medicines are reviewed at length with the patient today.  Concerns regarding medicines are outlined above.  No orders of the defined types were placed in this encounter.  No orders of the defined types were placed in this encounter.   Patient Instructions  Medication Instructions:  Your physician recommends that you continue on your current medications as directed. Please refer to the Current Medication list given to you today.  *If you need a refill on your cardiac medications before your next appointment, please call your pharmacy*   Lab Work: None Ordered If you have labs (blood work) drawn today and your tests are completely normal, you will receive your results only by: 05/04/20 MyChart Message (if you have MyChart) OR . A paper copy in the mail If you have any lab test that is abnormal or we need to  change your treatment, we will call you to review the results.   Testing/Procedures: None Ordered   Follow-Up: At [facility], you and your health needs are our priority.  As part of our continuing mission to provide you with exceptional heart care, we have created designated Provider Care Teams.  These Care Teams include your primary Cardiologist (physician) and Advanced Practice Providers (APPs -  Physician Assistants and Nurse Practitioners) who all work together to provide you with the care you need, when you need it.  We recommend signing up for the patient portal called "MyChart".  Sign up information is provided on this After Visit Summary.  MyChart is used to connect with  patients for Virtual Visits (Telemedicine).  Patients are able to view lab/test results, encounter notes, upcoming appointments, etc.  Non-urgent messages can be sent to your provider as well.   To learn more about what you can do with MyChart, go to [email or web address].    Your next appointment:   Follow up as needed   The format for your next appointment:   In Person  Provider:   Debbe Odea, MD   Other Instructions      Signed, Debbe Odea, MD  05/13/2020 1:12 PM    [facility]

## 2020-05-29 ENCOUNTER — Encounter: Payer: Self-pay | Admitting: Primary Care

## 2020-05-29 ENCOUNTER — Other Ambulatory Visit: Payer: Self-pay

## 2020-05-29 ENCOUNTER — Ambulatory Visit: Payer: Managed Care, Other (non HMO) | Admitting: Primary Care

## 2020-05-29 DIAGNOSIS — R0683 Snoring: Secondary | ICD-10-CM | POA: Diagnosis not present

## 2020-05-29 DIAGNOSIS — R0600 Dyspnea, unspecified: Secondary | ICD-10-CM

## 2020-05-29 DIAGNOSIS — R0602 Shortness of breath: Secondary | ICD-10-CM | POA: Diagnosis not present

## 2020-05-29 DIAGNOSIS — R0609 Other forms of dyspnea: Secondary | ICD-10-CM

## 2020-05-29 MED ORDER — MONTELUKAST SODIUM 10 MG PO TABS: 10.0000 mg | ORAL_TABLET | Freq: Every day | ORAL | 2 refills | Status: DC

## 2020-05-29 NOTE — Progress Notes (Signed)
@Patient  ID: , male    DOB: 17-Oct-1980, 40 y.o.   MRN: [id number]  Chief Complaint  Patient presents with  . sleep consult    Prior sleep study around 2008-neg for OSA. C/o loud snoring, daytime sleepiness and restless sleep.    Referring provider: [id number], FNP  HPI: 40 year old male, never smoker (second hand exposure).  Past medical history significant for hypertension, right bundle branch block, insomnia, obesity.  05/29/2020 Patient referred by PCP for sleep consult/DOE. He had sleep study in 2008, no evidence of OSA. He did not feel he got a good night sleep during study. He wakes up snoring when he sleeps on his back. He experiences numbness in arms when he sleeps on his side. He also reports restless sleep and some daytime fatigue. His sister has sleep apnea.   He was seen by PCP for shortness of breath and prolonged cough after covid. No known history of asthma. He was diagnosed with sarcoidosis in 2008 d/t abnormal image findings. He was treat with prednisone 60mg /day for almost a year.  Specialist at [facility] did not feel he had sarcoidosis, tapered off prednisone. He was recently given Symbicort 80 and Albuterol. When he first started taking Symbicort he noticed he was coughing more. He usually takes it every morning, sometimes he forgets to take it at night. Use albuterol prior to working out, he does not noticed as much shortness of breath. 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 He has lived with a cat for 7 years and hasn't noticed much of an allergy. He does at sometimes have itchy eyes. He takes Claritin daily. Denies wheezing or chest tightness.   CTA in November 2021 showed no evidence of edema, consolidation, effusion or pneumothorax. Mild air trapping left lower lobe.   Sleep Questionnaire Symptoms:Loud snoring, restless sleep, daytime sleepiness Previous sleep study: 2008 no evidence OSA Typical bedtime: 10-11pm Length of time  to fall asleep: 30-60 mins Nocturnal awakenings: 2-4 times Out of bed in the morning: 6-6:30am Epworth Score: 0  No Known Allergies  Immunization History  Administered Date(s) Administered  . Moderna Sars-Covid-2 Vaccination 11/15/2019, 12/13/2019    Past Medical History:  Diagnosis Date  . Allergy   . Hypertension   . Vertigo     Tobacco History: Social History   Tobacco Use  Smoking Status Never Smoker  Smokeless Tobacco Never Used   Counseling given: Not Answered   Outpatient Medications Prior to Visit  Medication Sig Dispense Refill  . albuterol (VENTOLIN HFA) 108 (90 Base) MCG/ACT inhaler Inhale 1-2 puffs into the lungs every 6 (six) hours as needed for wheezing or shortness of breath. 18 g 0  . budesonide-formoterol (SYMBICORT) 80-4.5 MCG/ACT inhaler Inhale 2 puffs into the lungs 2 (two) times daily. 1 each 3  . fluticasone (FLONASE) 50 MCG/ACT nasal spray Place 2 sprays into both nostrils daily. Prn 16 g 11  . lisinopril (ZESTRIL) 5 MG tablet Take 1 tablet (5 mg total) by mouth daily. 90 tablet 1  . loratadine (CLARITIN) 10 MG tablet Take 10 mg by mouth daily.    . traZODone (DESYREL) 50 MG tablet Take 0.5-1 tablets (25-50 mg total) by mouth at bedtime as needed for sleep. 30 tablet 3   No facility-administered medications prior to visit.      Review of Systems  Review of Systems  Constitutional: Positive for fatigue.  Respiratory: Positive for cough. Negative for chest tightness and wheezing.  DOE  Psychiatric/Behavioral: Positive for sleep disturbance.     Physical Exam  There were no vitals taken for this visit. Physical Exam Constitutional:      Appearance: Normal appearance.  Cardiovascular:     Rate and Rhythm: Normal rate and regular rhythm.  Pulmonary:     Effort: Pulmonary effort is normal.     Breath sounds: Normal breath sounds.  Neurological:     Mental Status: He is alert.  Psychiatric:        Mood and Affect: Mood normal.         Behavior: Behavior normal.        Thought Content: Thought content normal.        Judgment: Judgment normal.      Lab Results:  CBC    Component Value Date/Time   WBC 7.9 02/20/2020 1956   RBC 4.66 02/20/2020 1956   HGB 14.3 02/20/2020 1956   HGB 15.1 05/19/2019 0918   HCT 41.2 02/20/2020 1956   HCT 44.9 05/19/2019 0918   PLT 249 02/20/2020 1956   PLT 236 05/19/2019 0918   MCV 88.4 02/20/2020 1956   MCV 89 05/19/2019 0918   MCH 30.7 02/20/2020 1956   MCHC 34.7 02/20/2020 1956   RDW 12.2 02/20/2020 1956   RDW 13.1 05/19/2019 0918   LYMPHSABS 2.4 05/19/2019 0918   EOSABS 0.2 05/19/2019 0918   BASOSABS 0.1 05/19/2019 0918    BMET    Component Value Date/Time   NA 136 04/11/2020 0920   NA 137 04/04/2020 1117   K 4.3 04/11/2020 0920   CL 102 04/11/2020 0920   CO2 28 04/11/2020 0920   GLUCOSE 109 (H) 04/11/2020 0920   BUN 14 04/11/2020 0920   BUN 8 04/04/2020 1117   CREATININE 1.01 04/11/2020 0920   CALCIUM 10.3 04/11/2020 0920   GFRNONAA 87 04/04/2020 1117   GFRNONAA >60 02/20/2020 1956   GFRAA 100 04/04/2020 1117    BNP No results found for: BNP  ProBNP No results found for: PROBNP  Imaging: CT CORONARY MORPH W/CTA COR W/SCORE W/CA W/CM &/OR WO/CM  Addendum Date: 05/03/2020   ADDENDUM REPORT: 05/03/2020 09:22 EXAM: OVER-READ INTERPRETATION  CT CHEST The following report is an over-read performed by radiologist Dr. Schuyler Amor [facility] on 05/03/2020. This over-read does not include interpretation of cardiac or coronary anatomy or pathology. The coronary CTA and coronary calcium score interpretation by the cardiologist is attached. COMPARISON:  None. FINDINGS: No mediastinal mass or adenopathy identified. The lung bases are clear. No pleural effusion, airspace consolidation or atelectasis. Limited imaging through the upper abdomen shows no acute findings. No acute or suspicious osseous findings. IMPRESSION: Negative over-read. Electronically  Signed   By: Signa Kell M.D.   On: 05/03/2020 09:22   Result Date: 05/03/2020 CLINICAL DATA:  chest-pain EXAM: Cardiac/Coronary  CTA TECHNIQUE: The patient was scanned on a Siemens Somatoform go.Top scanner. FINDINGS: A retrospective scan was triggered in the descending thoracic aorta. Axial non-contrast 3 mm slices were carried out through the heart. The data set was analyzed on a dedicated work station and scored using the Agatson method. Gantry rotation speed was 330 msecs and collimation was .6 mm. 100mg  of metoprolol and 0.8 mg of sl NTG was given. The 3D data set was reconstructed in 5% intervals of the 60-95 % of the R-R cycle. Diastolic phases were analyzed on a dedicated work station using MPR, MIP and VRT modes. The patient received 85 cc of contrast. Aorta:  Normal  size.  No calcifications.  No dissection. Aortic Valve:  Trileaflet.  No calcifications. Coronary Arteries:  Normal coronary origin.  Right dominance. RCA is a large dominant artery that gives rise to PDA and PLA. There is no plaque. Left main is a large artery that gives rise to LAD and LCX arteries. LAD is a large vessel that has no plaque. LCX is a non-dominant artery that gives rise to one large OM1 branch. There is no plaque. Other findings: Normal pulmonary vein drainage into the left atrium. Normal left atrial appendage without a thrombus. Normal size of the pulmonary artery. IMPRESSION: 1. Coronary calcium score of 0. Patient is low risk for coronary events. 2. Normal coronary origin with right dominance. 3. No evidence of CAD. 4. CAD-RADS 0. No evidence of CAD (0%). Consider non-atherosclerotic causes of chest pain. 5. Image quality degraded by motion artifacts Electronically Signed: By: Debbe OdeaBrian  Agbor-Etang M.D. On: 05/02/2020 17:12   ECHOCARDIOGRAM COMPLETE  Result Date: 04/30/2020    ECHOCARDIOGRAM REPORT   Patient Name:   Jack Lewis Date of Exam: 04/30/2020 Medical Rec #:  [id number]      Height:       67.0 in Accession  #:    [id number]     Weight:       220.0 lb Date of Birth:  09-06-1980     BSA:          2.106 m Patient Age:    39 years       BP:           120/80 mmHg Patient Gender: M              HR:           73 bpm. Exam Location:  [place] Procedure: 2D Echo, Cardiac Doppler and Color Doppler Indications:    R07.9* Chest pain, unspecified  History:        Patient has no prior history of Echocardiogram examinations.                 Signs/Symptoms:fatigue with exertion and Chest Pain; Risk                 Factors:Non-Smoker, Hypertension, Dyslipidemia and Family                 History of Coronary Artery Disease.  Sonographer:    Vella KohlerMelissa Church BS, RVT, RDCS Referring Phys: [id number] BRIAN AGBOR-ETANG IMPRESSIONS  1. Left ventricular ejection fraction, by estimation, is 60 to 65%. The left ventricle has normal function. The left ventricle has no regional wall motion abnormalities. The left ventricular internal cavity size was mildly dilated. Left ventricular diastolic parameters were normal.  2. Right ventricular systolic function is normal. The right ventricular size is normal. There is normal pulmonary artery systolic pressure.  3. The mitral valve is normal in structure. Trivial mitral valve regurgitation. No evidence of mitral stenosis.  4. The aortic valve is tricuspid. Aortic valve regurgitation is not visualized. No aortic stenosis is present.  5. The inferior vena cava is normal in size with greater than 50% respiratory variability, suggesting right atrial pressure of 3 mmHg. FINDINGS  Left Ventricle: Left ventricular ejection fraction, by estimation, is 60 to 65%. The left ventricle has normal function. The left ventricle has no regional wall motion abnormalities. The left ventricular internal cavity size was mildly dilated. There is  no left ventricular hypertrophy. Left ventricular diastolic parameters were normal. Right Ventricle: The right ventricular size is normal.  No increase in right ventricular wall  thickness. Right ventricular systolic function is normal. There is normal pulmonary artery systolic pressure. The tricuspid regurgitant velocity is 1.94 m/s, and  with an assumed right atrial pressure of 3 mmHg, the estimated right ventricular systolic pressure is 18.1 mmHg. Left Atrium: Left atrial size was normal in size. Right Atrium: Right atrial size was normal in size. Pericardium: There is no evidence of pericardial effusion. Mitral Valve: The mitral valve is normal in structure. Trivial mitral valve regurgitation. No evidence of mitral valve stenosis. Tricuspid Valve: The tricuspid valve is normal in structure. Tricuspid valve regurgitation is mild. Aortic Valve: The aortic valve is tricuspid. Aortic valve regurgitation is not visualized. No aortic stenosis is present. Pulmonic Valve: The pulmonic valve was normal in structure. Pulmonic valve regurgitation is trivial. No evidence of pulmonic stenosis. Aorta: The aortic root and ascending aorta are structurally normal, with no evidence of dilitation. Pulmonary Artery: The pulmonary artery is not well seen. Venous: The inferior vena cava is normal in size with greater than 50% respiratory variability, suggesting right atrial pressure of 3 mmHg. IAS/Shunts: The interatrial septum was not well visualized.  LEFT VENTRICLE PLAX 2D LVIDd:         5.80 cm      Diastology LVIDs:         3.10 cm      LV e' medial:    9.57 cm/s LV PW:         0.80 cm      LV E/e' medial:  10.0 LV IVS:        0.70 cm      LV e' lateral:   15.20 cm/s LVOT diam:     2.20 cm      LV E/e' lateral: 6.3 LV SV:         93 LV SV Index:   44 LVOT Area:     3.80 cm                              3D Volume EF: LV Volumes (MOD)            3D EF:        64 % LV vol d, MOD A2C: 54.3 ml  LV EDV:       175 ml LV vol d, MOD A4C: 104.0 ml LV ESV:       63 ml LV vol s, MOD A2C: 25.3 ml  LV SV:        112 ml LV vol s, MOD A4C: 25.3 ml LV SV MOD A2C:     29.0 ml LV SV MOD A4C:     104.0 ml LV SV MOD BP:       50.4 ml RIGHT VENTRICLE RV Basal diam:  4.00 cm RV S prime:     13.10 cm/s TAPSE (M-mode): 2.6 cm LEFT ATRIUM             Index       RIGHT ATRIUM           Index LA diam:        3.50 cm 1.66 cm/m  RA Area:     17.40 cm LA Vol (A2C):   28.5 ml 13.53 ml/m RA Volume:   55.60 ml  26.40 ml/m LA Vol (A4C):   34.0 ml 16.15 ml/m LA Biplane Vol: 32.6 ml 15.48 ml/m  AORTIC VALVE  PULMONIC VALVE LVOT Vmax:   134.00 cm/s PV Vmax:       1.27 m/s LVOT Vmean:  90.600 cm/s PV Peak grad:  6.5 mmHg LVOT VTI:    0.245 m  AORTA Ao Root diam: 3.00 cm Ao Asc diam:  3.00 cm MITRAL VALVE               TRICUSPID VALVE MV Area (PHT): 4.49 cm    TR Peak grad:   15.1 mmHg MV Decel Time: 169 msec    TR Vmax:        194.00 cm/s MV E velocity: 95.80 cm/s MV A velocity: 90.00 cm/s  SHUNTS MV E/A ratio:  1.06        Systemic VTI:  0.24 m                            Systemic Diam: 2.20 cm Yvonne Kendall MD Electronically signed by Yvonne Kendall MD Signature Date/Time: 04/30/2020/7:03:11 PM    Final      Assessment & Plan:   Snoring - Patient reports symptoms of loud snoring, restless sleep, daytime sleepiness - I have a mild-moderate suspicion patient may have underlying sleep apnea, needs HST to evaluate  - Discussed with patient that untreated sleep apnea puts patient at a higher risk for cardiovascular disease, cardiac arrhythmias, pulmonary hypertension, diabetes, stroke and increase in daytime accidents. We reviewed treatment options including weight loss, side sleeping position, oral appliance, CPAP or referral to ENT for possible surgical interventions - Advised patient not to drive if experiencing excessive daytime fatigue or somnolence - FU in 4 weeks after sleep study to review result and treatment options  DOE (dyspnea on exertion) - Patient has been experiencing exertional dyspnea and cough. PCP started him on Symbicort . He has allergy to cats. Ordering full PFTs to evaluate for asthma. Start  singulair 10mg  at bedtime. Recommend allergen avoidance.  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 He had mild air trapping left lower lobe.   December 2021, NP 05/29/2020

## 2020-05-29 NOTE — Assessment & Plan Note (Addendum)
-   Patient reports symptoms of loud snoring, restless sleep, daytime sleepiness - I have a mild-moderate suspicion patient may have underlying sleep apnea, needs HST to evaluate  - Discussed with patient that untreated sleep apnea puts patient at a higher risk for cardiovascular disease, cardiac arrhythmias, pulmonary hypertension, diabetes, stroke and increase in daytime accidents. We reviewed treatment options including weight loss, side sleeping position, oral appliance, CPAP or referral to ENT for possible surgical interventions - Advised patient not to drive if experiencing excessive daytime fatigue or somnolence - FU in 4 weeks after sleep study to review result and treatment options

## 2020-05-29 NOTE — Patient Instructions (Addendum)
  Risk for sleep apnea:  Untreated sleep apnea puts you at high risk for cardiovascular disease, cardiac arrhythmias, pulmonary hypertension, stroke, diabetes, increased risk for daytime accidents   Treatment options for sleep apnea include weight loss, side sleeping position, oral appliance, CPAP, referral to ENT for possible surgical intervention   Do not drive if experiencing excessive daytime fatigue or somnolence   Shortness of breath: Continue Symbicort 2 puffs twice daily (rinse mouth after use) Use albuterol rescue inhaler 2 puffs every 6 hours for shortness of breath/wheezing Checking pulmonary function testing   Allergic rhinitis: Continue Claritin Continue flonase Start Singulair 10mg  at bedtime    Orders: PFTs  Home sleep study   Rx: Singulair 10mg  at bedtime     Follow-up: 4-6 weeks fu  (after sleep study and PFTs)

## 2020-05-29 NOTE — Assessment & Plan Note (Addendum)
-   Patient has been experiencing exertional dyspnea and cough. PCP started him on Symbicort . He has allergy to cats. Ordering full PFTs to evaluate for asthma. Start singulair 10mg  at bedtime. Recommend allergen avoidance.  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 He had mild air trapping left lower lobe.

## 2020-06-05 NOTE — Progress Notes (Signed)
Reviewed and agree with assessment/plan.   Kynzlie Hilleary, MD  Pulmonary/Critical Care 06/05/2020, 10:59 AM Pager:  [phone]  

## 2020-06-11 ENCOUNTER — Other Ambulatory Visit: Payer: Self-pay | Admitting: *Deleted

## 2020-06-11 DIAGNOSIS — E291 Testicular hypofunction: Secondary | ICD-10-CM

## 2020-06-13 ENCOUNTER — Other Ambulatory Visit: Payer: Managed Care, Other (non HMO)

## 2020-06-13 ENCOUNTER — Other Ambulatory Visit: Payer: Self-pay

## 2020-06-13 DIAGNOSIS — E291 Testicular hypofunction: Secondary | ICD-10-CM

## 2020-06-14 ENCOUNTER — Telehealth: Payer: Self-pay

## 2020-06-14 LAB — TESTOSTERONE: Testosterone: 469 ng/dL (ref 264–916)

## 2020-06-14 NOTE — Telephone Encounter (Signed)
Patient is aware of date/time of covid test prior to PFT.  

## 2020-06-17 ENCOUNTER — Encounter: Payer: Self-pay | Admitting: *Deleted

## 2020-06-17 ENCOUNTER — Other Ambulatory Visit: Admission: RE | Admit: 2020-06-17 | Payer: Managed Care, Other (non HMO) | Source: Ambulatory Visit

## 2020-06-17 ENCOUNTER — Telehealth: Payer: Self-pay | Admitting: *Deleted

## 2020-06-17 NOTE — Telephone Encounter (Signed)
-----   Message from Riki Altes, MD sent at 06/15/2020 11:25 AM EST ----- Testosterone level normal at 469.  Please add on a free testosterone

## 2020-06-17 NOTE — Telephone Encounter (Signed)
Called and added lab on .

## 2020-06-18 ENCOUNTER — Ambulatory Visit: Payer: Managed Care, Other (non HMO) | Attending: Primary Care

## 2020-06-18 ENCOUNTER — Other Ambulatory Visit: Payer: Self-pay

## 2020-06-18 LAB — TESTOSTERONE FREE, PROFILE I
Albumin: 4.5 g/dL (ref 4.0–5.0)
Sex Hormone Binding: 46.7 nmol/L (ref 16.5–55.9)
Testost., Free, Calc: 74.3 pg/mL (ref 42.3–190.0)
Testosterone: 458 ng/dL (ref 264–916)

## 2020-06-18 LAB — SPECIMEN STATUS REPORT

## 2020-06-18 MED ORDER — TRAZODONE HCL 50 MG PO TABS: 25.0000 mg | ORAL_TABLET | Freq: Every evening | ORAL | 3 refills | Status: DC | PRN

## 2020-06-18 MED ORDER — TRAZODONE HCL 50 MG PO TABS
25.0000 mg | ORAL_TABLET | Freq: Every evening | ORAL | 3 refills | Status: DC | PRN
Start: 2020-06-18 — End: 2020-06-18

## 2020-06-19 ENCOUNTER — Other Ambulatory Visit: Payer: Self-pay

## 2020-06-19 MED ORDER — TRAZODONE HCL 50 MG PO TABS: 25.0000 mg | ORAL_TABLET | Freq: Every evening | ORAL | 3 refills | Status: DC | PRN

## 2020-06-23 ENCOUNTER — Other Ambulatory Visit: Payer: Self-pay | Admitting: Urology

## 2020-06-24 ENCOUNTER — Encounter: Payer: Self-pay | Admitting: *Deleted

## 2020-06-27 ENCOUNTER — Ambulatory Visit: Payer: Managed Care, Other (non HMO)

## 2020-06-27 ENCOUNTER — Other Ambulatory Visit: Payer: Self-pay

## 2020-06-27 DIAGNOSIS — R0683 Snoring: Secondary | ICD-10-CM

## 2020-07-07 ENCOUNTER — Emergency Department: Payer: Managed Care, Other (non HMO)

## 2020-07-07 ENCOUNTER — Emergency Department
Admission: EM | Admit: 2020-07-07 | Discharge: 2020-07-07 | Disposition: A | Discharge status: Home / Self Care | Payer: Managed Care, Other (non HMO) | Attending: Emergency Medicine | Admitting: Emergency Medicine

## 2020-07-07 ENCOUNTER — Other Ambulatory Visit: Payer: Self-pay

## 2020-07-07 DIAGNOSIS — Z79899 Other long term (current) drug therapy: Secondary | ICD-10-CM | POA: Diagnosis not present

## 2020-07-07 DIAGNOSIS — R519 Headache, unspecified: Secondary | ICD-10-CM | POA: Insufficient documentation

## 2020-07-07 DIAGNOSIS — I10 Essential (primary) hypertension: Secondary | ICD-10-CM | POA: Diagnosis not present

## 2020-07-07 DIAGNOSIS — Z8616 Personal history of COVID-19: Secondary | ICD-10-CM | POA: Diagnosis not present

## 2020-07-07 LAB — CBC
HCT: 38.3 % — ABNORMAL LOW (ref 39.0–52.0)
Hemoglobin: 12.4 g/dL — ABNORMAL LOW (ref 13.0–17.0)
MCH: 29.3 pg (ref 26.0–34.0)
MCHC: 32.4 g/dL (ref 30.0–36.0)
MCV: 90.5 fL (ref 80.0–100.0)
Platelets: 224 10*3/uL (ref 150–400)
RBC: 4.23 MIL/uL (ref 4.22–5.81)
RDW: 13 % (ref 11.5–15.5)
WBC: 6 10*3/uL (ref 4.0–10.5)
nRBC: 0 % (ref 0.0–0.2)

## 2020-07-07 LAB — COMPREHENSIVE METABOLIC PANEL
ALT: 70 U/L — ABNORMAL HIGH (ref 0–44)
AST: 41 U/L (ref 15–41)
Albumin: 3.9 g/dL (ref 3.5–5.0)
Alkaline Phosphatase: 46 U/L (ref 38–126)
Anion gap: 8 (ref 5–15)
BUN: 9 mg/dL (ref 6–20)
CO2: 26 mmol/L (ref 22–32)
Calcium: 9.7 mg/dL (ref 8.9–10.3)
Chloride: 105 mmol/L (ref 98–111)
Creatinine, Ser: 0.96 mg/dL (ref 0.61–1.24)
GFR, Estimated: >60 mL/min (ref >60)
Glucose, Bld: 140 mg/dL — ABNORMAL HIGH (ref 70–99)
Potassium: 3.6 mmol/L (ref 3.5–5.1)
Sodium: 139 mmol/L (ref 135–145)
Total Bilirubin: 0.7 mg/dL (ref 0.3–1.2)
Total Protein: 6.9 g/dL (ref 6.5–8.1)

## 2020-07-07 MED ORDER — PROCHLORPERAZINE EDISYLATE 10 MG/2ML IJ SOLN
10.0000 mg | Freq: Once | INTRAMUSCULAR | Status: AC
  Administered 2020-07-07: 10 mg via INTRAVENOUS
  Filled 2020-07-07: qty 2

## 2020-07-07 MED ORDER — MAGNESIUM SULFATE 2 GM/50ML IV SOLN
2.0000 g | Freq: Once | INTRAVENOUS | Status: AC
  Administered 2020-07-07: 2 g via INTRAVENOUS
  Filled 2020-07-07: qty 50

## 2020-07-07 MED ORDER — LACTATED RINGERS IV BOLUS
1000.0000 mL | Freq: Once | INTRAVENOUS | Status: AC
  Administered 2020-07-07: 1000 mL via INTRAVENOUS

## 2020-07-07 MED ORDER — DIPHENHYDRAMINE HCL 25 MG PO CAPS
25.0000 mg | ORAL_CAPSULE | Freq: Once | ORAL | Status: AC
  Administered 2020-07-07: 25 mg via ORAL
  Filled 2020-07-07: qty 1

## 2020-07-07 NOTE — ED Provider Notes (Signed)
[facility] Emergency Department Provider Note  ____________________________________________   Event Date/Time   First MD Initiated Contact with Patient 07/07/20 1741     (approximate)  I have reviewed the triage vital signs and the nursing notes.   HISTORY  Chief Complaint Hypertension   HPI Jack Lewis is a 40 y.o. male with a past medical history of HTN currently on lisinopril who presents for assessment of headache that began between 1 and 2 PM today while he was working at the gym.  Patient states he got his blood pressure checked when he got home and it was 180s.  States this was worrisome he was advised by a friend to come get checked out the emergency room.  States his headache seemed a little worse whenever he exerts himself and is now down to just a dull pressure.  He denies any vision changes or recent traumatic injuries or falls.  Denies any neck pain, chest pain, cough, shortness of breath, fevers, chills, nausea, vomiting, diarrhea, dysuria, rash, urinary symptoms, or focal extremity weakness numbness or tingling.  Does not take any blood thinners.  No EtOH abuse, of drug use or tobacco abuse.          Past Medical History:  Diagnosis Date  . Allergy   . Hypertension   . Vertigo     Patient Active Problem List   Diagnosis Date Noted  . Snoring 05/29/2020  . DOE (dyspnea on exertion) 05/06/2020  . Bronchitis 04/05/2020  . RBBB 03/07/2020  . Chest pain 03/06/2020  . Low testosterone 03/06/2020  . COVID-19 virus infection 02/15/2020  . Class 1 obesity due to excess calories without serious comorbidity with body mass index (BMI) of 34.0 to 34.9 in adult 02/15/2020  . Insomnia 02/02/2020  . Obesity (BMI 35.0-39.9 without comorbidity) 02/02/2017  . Hypertension 07/30/2015    Past Surgical History:  Procedure Laterality Date  . APPENDECTOMY    . VASECTOMY    . VASECTOMY REVERSAL      Prior to Admission medications   Medication  Sig Start Date End Date Taking? Authorizing Provider  albuterol (VENTOLIN HFA) 108 (90 Base) MCG/ACT inhaler Inhale 1-2 puffs into the lungs every 6 (six) hours as needed for wheezing or shortness of breath. 04/05/20   McLean-Scocuzza, Pasty Spillers, MD  budesonide-formoterol (SYMBICORT) 80-4.5 MCG/ACT inhaler Inhale 2 puffs into the lungs 2 (two) times daily. 05/06/20   [name], FNP  fluticasone (FLONASE) 50 MCG/ACT nasal spray Place 2 sprays into both nostrils daily. Prn 04/05/20   McLean-Scocuzza, Pasty Spillers, MD  lisinopril (ZESTRIL) 5 MG tablet Take 1 tablet (5 mg total) by mouth daily. 02/02/20   [name], FNP  loratadine (CLARITIN) 10 MG tablet Take 10 mg by mouth daily.    [provider]  montelukast (SINGULAIR) 10 MG tablet Take 1 tablet (10 mg total) by mouth at bedtime. 05/29/20   Glenford Bayley, NP  traZODone (DESYREL) 50 MG tablet Take 0.5-1 tablets (25-50 mg total) by mouth at bedtime as needed. 06/19/20   [name], FNP    Allergies Patient has no known allergies.  Family History  Problem Relation Age of Onset  . Hypertension Mother   . Diabetes Father   . Hypertension Father   . Heart attack Father 13  . Heart attack Paternal Grandfather 62    Social History Social History   Tobacco Use  . Smoking status: Never Smoker  . Smokeless tobacco: Never Used  Vaping Use  . Vaping Use: Never used  Substance Use Topics  . Alcohol use: Not Currently    Alcohol/week: 0.0 standard drinks    Comment: occasional  . Drug use: No    Review of Systems  Review of Systems  Constitutional: Negative for chills and fever.  HENT: Negative for sore throat.   Eyes: Negative for pain.  Respiratory: Negative for cough and stridor.   Cardiovascular: Negative for chest pain.  Gastrointestinal: Negative for vomiting.  Skin: Negative for rash.  Neurological: Positive for headaches. Negative for seizures and loss of consciousness.  Psychiatric/Behavioral:  Negative for suicidal ideas.  All other systems reviewed and are negative.     ____________________________________________   PHYSICAL EXAM:  VITAL SIGNS: ED Triage Vitals  Enc Vitals Group     BP 07/07/20 1625 (!) 172/87     Pulse Rate 07/07/20 1625 90     Resp 07/07/20 1625 18     Temp 07/07/20 1625 97.7 F (36.5 C)     Temp Source 07/07/20 1625 Oral     SpO2 07/07/20 1625 100 %     Weight 07/07/20 1626 213 lb (96.6 kg)     Height 07/07/20 1626 5\' 7"  (1.702 m)     Head Circumference --      Peak Flow --      Pain Score 07/07/20 1626 4     Pain Loc --      Pain Edu? --      Excl. in GC? --    Vitals:   07/07/20 1808 07/07/20 1924  BP: (!) 159/78 (!) 147/81  Pulse: 84 86  Resp: 18 18  Temp:    SpO2: 100% 95%   Physical Exam Vitals and nursing note reviewed.  Constitutional:      Appearance: He is well-developed.  HENT:     Head: Normocephalic and atraumatic.     Right Ear: External ear normal.     Left Ear: External ear normal.     Nose: Nose normal.     Mouth/Throat:     Mouth: Mucous membranes are moist.  Eyes:     Conjunctiva/sclera: Conjunctivae normal.  Cardiovascular:     Rate and Rhythm: Normal rate and regular rhythm.     Heart sounds: No murmur heard.   Pulmonary:     Effort: Pulmonary effort is normal. No respiratory distress.     Breath sounds: Normal breath sounds.  Abdominal:     Palpations: Abdomen is soft.     Tenderness: There is no abdominal tenderness.  Musculoskeletal:     Cervical back: Neck supple. No rigidity.     Right lower leg: No edema.     Left lower leg: No edema.  Skin:    General: Skin is warm and dry.     Capillary Refill: Capillary refill takes less than 2 seconds.  Neurological:     Mental Status: He is alert and oriented to person, place, and time.  Psychiatric:        Mood and Affect: Mood normal.     Cranial nerves II through XII grossly intact.  No pronator drift.  No finger dysmetria.  Symmetric 5/5  strength of all extremities.  Sensation intact to light touch in all extremities.  Unremarkable unassisted gait.  ____________________________________________   LABS (all labs ordered are listed, but only abnormal results are displayed)  Labs Reviewed  CBC - Abnormal; Notable for the following components:      Result Value  Hemoglobin 12.4 (*)    HCT 38.3 (*)    All other components within normal limits  COMPREHENSIVE METABOLIC PANEL - Abnormal; Notable for the following components:   Glucose, Bld 140 (*)    ALT 70 (*)    All other components within normal limits   ____________________________________________  EKG  ____________________________________________  RADIOLOGY  ED MD interpretation: No intracranial abnormalities.  Official radiology report(s): CT Head Wo Contrast  Result Date: 07/07/2020 CLINICAL DATA:  Headache, hypertension EXAM: CT HEAD WITHOUT CONTRAST TECHNIQUE: Contiguous axial images were obtained from the base of the skull through the vertex without intravenous contrast. COMPARISON:  None. FINDINGS: Brain: No acute intracranial abnormality. Specifically, no hemorrhage, hydrocephalus, mass lesion, acute infarction, or significant intracranial injury. Vascular: No hyperdense vessel or unexpected calcification. Skull: No acute calvarial abnormality. Sinuses/Orbits: Visualized paranasal sinuses and mastoids clear. Orbital soft tissues unremarkable. Other: None IMPRESSION: Normal study. Electronically Signed   By: Charlett Nose M.D.   On: 07/07/2020 19:08    ____________________________________________   PROCEDURES  Procedure(s) performed (including Critical Care):  Procedures   ____________________________________________   INITIAL IMPRESSION / ASSESSMENT AND PLAN / ED COURSE      Patient presents for assessment of fairly sudden onset of headache that began while he was working out at the gym earlier today.  This was within 6 hours of arrival.  He does  have history of high blood pressure although states he is typically normotensive with systolics in the 120s but today was in the 180s when he checked at home.  On arrival patient is hypertensive with BP 172/87 otherwise stable vital signs on room air.  He has a nonfocal neuro exam and has no meningismus or neck stiffness.  No history or exam findings to suggest acute trauma, toxic ingestion, significant metabolic derangements or acute infectious process.  Will obtain CT head to rule out possible spontaneous SAH.  If this is negative do not believe further work-up for this will be required as onset was within 6 hours.  Given his elevated blood pressure certainly could be related to some hypertensive urgency versus other primary headache migraine or tension headache.  We will plan to treat with migraine cocktail pending CT.  CT is unremarkable.  On reassessment patient's blood pressure is improved to 147/81 and he states his headache is resolved.  Advised him that he should have his blood pressure rechecked in a couple days by his PCP.  However given stable vitals with resolution of headache and otherwise reassuring exam and head CT without evidence of hemorrhage, I believe he is safe for discharge with plan for outpatient follow-up.  Discharged in stable condition.  Strict return cautions advised discussed.        ____________________________________________   FINAL CLINICAL IMPRESSION(S) / ED DIAGNOSES  Final diagnoses:  Nonintractable headache, unspecified chronicity pattern, unspecified headache type  Hypertension, unspecified type    Medications  lactated ringers bolus 1,000 mL (0 mLs Intravenous Stopped 07/07/20 1924)  magnesium sulfate IVPB 2 g 50 mL (0 g Intravenous Stopped 07/07/20 1907)  prochlorperazine (COMPAZINE) injection 10 mg (10 mg Intravenous Given 07/07/20 1803)  diphenhydrAMINE (BENADRYL) capsule 25 mg (25 mg Oral Given 07/07/20 1802)     ED Discharge Orders    None        Note:  This document was prepared using Dragon voice recognition software and may include unintentional dictation errors.   Gilles Chiquito, MD 07/07/20 [id number]

## 2020-07-07 NOTE — ED Triage Notes (Addendum)
Pt states he has a hx of hypertension- pt states that he was at the gym and he got a bad headache and when he checked his BP when he got home it was elevated at 183 systolic- pt states he takes lisinopril for his BP and has not missed any doses

## 2020-07-10 ENCOUNTER — Ambulatory Visit: Payer: Managed Care, Other (non HMO) | Admitting: Family

## 2020-07-10 ENCOUNTER — Encounter: Payer: Self-pay | Admitting: Family

## 2020-07-10 ENCOUNTER — Other Ambulatory Visit: Payer: Self-pay

## 2020-07-10 ENCOUNTER — Telehealth: Payer: Self-pay | Admitting: Family

## 2020-07-10 VITALS — BP 132/88 | HR 85 | Temp 98.3°F | Ht 67.0 in | Wt 216.8 lb

## 2020-07-10 DIAGNOSIS — I10 Essential (primary) hypertension: Secondary | ICD-10-CM | POA: Diagnosis not present

## 2020-07-10 DIAGNOSIS — G4484 Primary exertional headache: Secondary | ICD-10-CM | POA: Insufficient documentation

## 2020-07-10 DIAGNOSIS — R519 Headache, unspecified: Secondary | ICD-10-CM

## 2020-07-10 MED ORDER — PROPRANOLOL HCL 20 MG PO TABS: 20.0000 mg | ORAL_TABLET | Freq: Two times a day (BID) | ORAL | 1 refills | Status: DC

## 2020-07-10 MED ORDER — LISINOPRIL 5 MG PO TABS: 10.0000 mg | ORAL_TABLET | Freq: Every day | ORAL | 1 refills | Status: DC

## 2020-07-10 NOTE — Telephone Encounter (Signed)
MRI brain was approved by ins, waiting on MRA to be approved ins req office notes.

## 2020-07-10 NOTE — Telephone Encounter (Signed)
I have ordered stat mri and mra How soon can we schedule?

## 2020-07-10 NOTE — Patient Instructions (Signed)
Start propranolol 20mg  twice per day Stop tylenol and ibuprofen  Ordering MRI brain, MRA.   No heavy lifting until we get results back.

## 2020-07-10 NOTE — Progress Notes (Signed)
Subjective:    Patient ID: Jack Lewis, male    DOB: 1981/03/08, 40 y.o.   MRN: [id number]  CC: Jack Lewis is a 40 y.o. male who presents today for an acute visit.    HPI: Complains of left sided HA with exertion x 4 days.     4 days ago he was at the gym, did a pull down with weights and after 30 repetitions, he had sudden onset left sided HA . This was 'worse HA of life.' Pain so severe, he was almost in tears . He lowered the amount of the weight and tried to pull up, and HA returned. He left gym to go home where there he decided to go to ED.   Since ED visit, he has noticed that HA seems associated with repetitive motion, such as several reps of lifting weight or riding bike for period of time.  No temporal pain, associated dizziness, vomiting, vision changes, neck pain, cp, leg swelling.  Endorses photophobia, chronic sinus drainage ( h/o seasonal allergies).   HA  Is also exacerbated with cough, sneezing, valsalva.  He goes to gym 1-1.5 hour 5-6 days/week.    No h/o HA.   Taking ibuprofen 800mg  BID for the past 4 days; taking extra strength tylenol BID without relief.   Compliant with lisinopril 5mg . At home usually SBP 117/60s. 4 days ago he self increased lisinopril to 10mg .   Presented to ED 3 days ago for HA which occurred when at the gym. Systolic blood pressure as 180 at that time. In ED 172/87. Given migraine cocktail. BP at discharge 147/81.   Non smoker. No h/o connective tissue disease.   Liver enzymes elevated. CT head no hemorrhage, hydrocephalus, mass lesion, acute infarction, or significant intracranial injury. HISTORY:   Past Medical History:  Diagnosis Date  . Allergy   . Hypertension   . Vertigo    Past Surgical History:  Procedure Laterality Date  . APPENDECTOMY    . VASECTOMY    . VASECTOMY REVERSAL     Family History  Problem Relation Age of Onset  . Hypertension Mother   . Diabetes Father   . Hypertension Father   . Heart attack  Father 29  . Heart attack Paternal Grandfather 37    Allergies: Patient has no known allergies. Current Outpatient Medications on File Prior to Visit  Medication Sig Dispense Refill  . albuterol (VENTOLIN HFA) 108 (90 Base) MCG/ACT inhaler Inhale 1-2 puffs into the lungs every 6 (six) hours as needed for wheezing or shortness of breath. 18 g 0  . budesonide-formoterol (SYMBICORT) 80-4.5 MCG/ACT inhaler Inhale 2 puffs into the lungs 2 (two) times daily. 1 each 3  . fluticasone (FLONASE) 50 MCG/ACT nasal spray Place 2 sprays into both nostrils daily. Prn 16 g 11  . loratadine (CLARITIN) 10 MG tablet Take 10 mg by mouth daily.    . montelukast (SINGULAIR) 10 MG tablet Take 1 tablet (10 mg total) by mouth at bedtime. 30 tablet 2  . traZODone (DESYREL) 50 MG tablet Take 0.5-1 tablets (25-50 mg total) by mouth at bedtime as needed. 30 tablet 3   No current facility-administered medications on file prior to visit.    Social History   Tobacco Use  . Smoking status: Never Smoker  . Smokeless tobacco: Never Used  Vaping Use  . Vaping Use: Never used  Substance Use Topics  . Alcohol use: Not Currently    Alcohol/week: 0.0 standard drinks  Comment: occasional  . Drug use: No    Review of Systems  Constitutional: Negative for chills and fever.  Eyes: Negative for visual disturbance.  Respiratory: Negative for cough.   Cardiovascular: Negative for chest pain and palpitations.  Gastrointestinal: Negative for nausea and vomiting.  Neurological: Positive for headaches. Negative for dizziness, syncope and weakness.      Objective:    BP 132/88   Pulse 85   Temp 98.3 F (36.8 C)   Ht 5\' 7"  (1.702 m)   Wt 216 lb 12.8 oz (98.3 kg)   SpO2 98%   BMI 33.96 kg/m   BP Readings from Last 3 Encounters:  07/10/20 132/88  07/07/20 (!) 147/81  05/13/20 110/80    Physical Exam Vitals reviewed.  Constitutional:      Appearance: He is well-developed.  HENT:     Right Ear: Hearing  normal.     Left Ear: Hearing normal.     Mouth/Throat:     Pharynx: Uvula midline. No posterior oropharyngeal erythema.  Eyes:     General: Lids are normal. Lids are everted, no foreign bodies appreciated.     Conjunctiva/sclera: Conjunctivae normal.     Pupils: Pupils are equal, round, and reactive to light.     Comments: Normal fundus bilaterally.  Neck:     Vascular: No carotid bruit.  Cardiovascular:     Rate and Rhythm: Regular rhythm.     Heart sounds: Normal heart sounds.  Pulmonary:     Effort: Pulmonary effort is normal. No respiratory distress.     Breath sounds: Normal breath sounds. No wheezing, rhonchi or rales.  Lymphadenopathy:     Head:     Right side of head: No submental, submandibular, tonsillar, preauricular, posterior auricular or occipital adenopathy.     Left side of head: No submental, submandibular, tonsillar, preauricular, posterior auricular or occipital adenopathy.     Cervical: No cervical adenopathy.  Skin:    General: Skin is warm and dry.  Neurological:     Mental Status: He is alert.     Cranial Nerves: No cranial nerve deficit.     Sensory: No sensory deficit.     Deep Tendon Reflexes:     Reflex Scores:      Bicep reflexes are 2+ on the right side and 2+ on the left side.      Patellar reflexes are 2+ on the right side and 2+ on the left side.    Comments: Grip equal and strong bilateral upper extremities. Gait strong and steady. Able to perform rapid alternating movement without difficulty.  Psychiatric:        Speech: Speech normal.        Behavior: Behavior normal.        Assessment & Plan:    Problem List Items Addressed This Visit      Cardiovascular and Mediastinum   Hypertension    Controlled. He has increased lisinopril to 10mg . Continue. Pending  Labs.       Relevant Medications   propranolol (INDERAL) 20 MG tablet   lisinopril (ZESTRIL) 5 MG tablet     Other   Exertional headache - Primary    Sudden onset with no  prior h/o HA. Normal neurologic exam today and CT head negative in ED 3 days ago. Pending MRI brain, MRA head. Sent message to Dr 05/15/20 regarding if MRA neck necessary; she advised MRA head, MRI brain appropriate. She also advised that beta blocker may be used prior to  work out or daily. Advised patient no strenuous activity including his workouts. Start propranolol 20mg  bid. Case reviewed with supervising, Dr , and she and I jointly agreed on management plan. Close follow up.        Relevant Medications   propranolol (INDERAL) 20 MG tablet       I have changed Duncan Dull E. Pebley's lisinopril. I am also having him start on propranolol. Additionally, I am having him maintain his loratadine, fluticasone, albuterol, budesonide-formoterol, montelukast, and traZODone.   Meds ordered this encounter  Medications  . propranolol (INDERAL) 20 MG tablet    Sig: Take 1 tablet (20 mg total) by mouth 2 (two) times daily.    Dispense:  120 tablet    Refill:  1    Order Specific Question:   Supervising Provider    Answer:   Sharlet Salina L [[id number]]  . lisinopril (ZESTRIL) 5 MG tablet    Sig: Take 2 tablets (10 mg total) by mouth daily.    Dispense:  90 tablet    Refill:  1    Order Specific Question:   Supervising Provider    Answer:   Duncan Dull [[id number]]    Return precautions given.   Risks, benefits, and alternatives of the medications and treatment plan prescribed today were discussed, and patient expressed understanding.   Education regarding symptom management and diagnosis given to patient on AVS.  Continue to follow with [name], FNP for routine health maintenance.   [name] and I agreed with plan.   [name], FNP

## 2020-07-10 NOTE — Assessment & Plan Note (Addendum)
Sudden onset with no prior h/o HA. Normal neurologic exam today and CT head negative in ED 3 days ago. Pending MRI brain, MRA head. Sent message to Dr Lucia Gaskins regarding if MRA neck necessary; she advised MRA head, MRI brain appropriate. She also advised that beta blocker may be used prior to work out or daily. Advised patient no strenuous activity including his workouts. Start propranolol 20mg  bid. Case reviewed with supervising, Dr , and she and I jointly agreed on management plan. Close follow up.

## 2020-07-10 NOTE — Assessment & Plan Note (Signed)
Controlled. He has increased lisinopril to 10mg . Continue. Pending  Labs.

## 2020-07-12 ENCOUNTER — Telehealth: Payer: Self-pay | Admitting: Family

## 2020-07-12 ENCOUNTER — Encounter: Payer: Self-pay | Admitting: Family

## 2020-07-12 ENCOUNTER — Other Ambulatory Visit: Payer: Self-pay

## 2020-07-12 ENCOUNTER — Ambulatory Visit
Admission: RE | Admit: 2020-07-12 | Discharge: 2020-07-12 | Disposition: A | Discharge status: Home / Self Care | Payer: Managed Care, Other (non HMO) | Source: Ambulatory Visit | Attending: Family | Admitting: Family

## 2020-07-12 DIAGNOSIS — R519 Headache, unspecified: Secondary | ICD-10-CM | POA: Diagnosis not present

## 2020-07-12 MED ORDER — GADOBUTROL 1 MMOL/ML IV SOLN
7.5000 mL | Freq: Once | INTRAVENOUS | Status: AC | PRN
  Administered 2020-07-12: 7.5 mL via INTRAVENOUS

## 2020-07-12 NOTE — Telephone Encounter (Signed)
Pt wife called regarding pt MRI results. Pt wife would like a call as soon as the results come in. Please and Thank you!

## 2020-07-12 NOTE — Telephone Encounter (Signed)
Called and spoke to Jack Lewis over MRI results. Informed him that they have not been reviewed as of yet and that Jack Lewis does not have the results. He voiced his concerns and fears and I informed him that Jack Lewis said a call will be made if it is urgent or if there is any urgent findings, even over the weekend. He should expect a non urgent call sometime next week once the results are in. Jack Lewis verbalized understanding and had no further questions.

## 2020-07-15 ENCOUNTER — Telehealth: Payer: Self-pay | Admitting: Family

## 2020-07-15 ENCOUNTER — Telehealth: Payer: Self-pay

## 2020-07-15 DIAGNOSIS — R519 Headache, unspecified: Secondary | ICD-10-CM

## 2020-07-15 DIAGNOSIS — I10 Essential (primary) hypertension: Secondary | ICD-10-CM

## 2020-07-15 MED ORDER — LISINOPRIL 20 MG PO TABS: 20.0000 mg | ORAL_TABLET | Freq: Every day | ORAL | 1 refills | Status: DC

## 2020-07-15 NOTE — Telephone Encounter (Signed)
I have spoken with patient 

## 2020-07-15 NOTE — Telephone Encounter (Signed)
Patient has sent message to multiple providers. Please advise.       [facility] Night - Cl TELEPHONE ADVICE RECORD [facility] Patient Name: Jack Lewis Gender: Male DOB: [birth year] Age: 40 Y 4 M 11 D Return Phone Number: [phone] (Primary), [phone] (Secondary) Address: City/State/Zip: [place] [address] Client [facility] Client Site [facility] - [facility] Physician [name]- NP Contact Type Call Who Is Calling Patient / Member / Family / Caregiver Call Type Triage / Clinical Caller Name [name] Relationship To Patient Spouse Return Phone Number [phone] (Primary) Chief Complaint HEAD INJURY - and not acting right. Change in behaviour after hitting head. Reason for Call Symptomatic / Request for Health Information Initial Comment Caller states her husband got his results back and it shwso he has an aneurysm. Translation No Nurse Assessment Nurse: Daphine Deutscher, RN, Cala Bradford Date/Time Lamount Cohen Time): 07/12/2020 7:39:21 PM Confirm and document reason for call. If symptomatic, describe symptoms. ---caller states her husband has a severe headache. states he had an MRI done today and the results show a brain aneurism. she is concerned and wants to know why the drs have no contacted her yet. no fever. Does the patient have any new or worsening symptoms? ---Yes Will a triage be completed? ---Yes Related visit to physician within the last 2 weeks? ---Yes Does the PT have any chronic conditions? (i.e. diabetes, asthma, this includes High risk factors for pregnancy, etc.) ---Yes List chronic conditions. ---HTN Is this a behavioral health or substance abuse call? ---No Guidelines Guideline Title Affirmed Question Affirmed Notes Nurse Date/Time Lamount Cohen Time) Headache [1] SEVERE headache (e.g., excruciating) AND [2] "worst headache" of life Abram Sander  07/12/2020 7:41:25 PM Disp. Time Lamount Cohen Time) Disposition Final User 07/12/2020 7:37:59 PM Send to Urgent Queue Young Berry 07/12/2020 7:44:19 PM Paged On Call back to Call Center Wilson, RN, Cala Bradford PLEASE NOTE: All timestamps contained within this report are represented as [place] Standard Time. CONFIDENTIALTY NOTICE: This fax transmission is intended only for the addressee. It contains information that is legally privileged, confidential or otherwise protected from use or disclosure. If you are not the intended recipient, you are strictly prohibited from reviewing, disclosing, copying using or disseminating any of this information or taking any action in reliance on or regarding this information. If you have received this fax in error, please notify us immediately by telephone so that we can arrange for its return to [place]. Phone: [phone], Toll-Free: [phone], Fax: [phone] Page: 2 of 2 Call Id: [id number] 07/12/2020 7:43:47 PM Go to ED Now (or PCP triage) Yes Daphine Deutscher, RN, Anders Simmonds Disagree/Comply Comply Caller Understands Yes PreDisposition Call Doctor Care Advice Given Per Guideline GO TO ED NOW (OR PCP TRIAGE): * IF PCP SECOND-LEVEL TRIAGE REQUIRED: You may need to be seen. Your doctor (or NP/PA) will want to talk with you to decide what's best. I'll page the provider on-call now. If you haven't heard from the provider (or me) within 30 minutes, go directly to the ED/UCC at _____________ Hospital. ANOTHER ADULT SHOULD DRIVE: * It is better and safer if another adult drives instead of you. CARE ADVICE given per Headache (Adult) guideline. Referrals GO TO FACILITY REFUSED Paging DoctorName Phone DateTime Result/Outcome Message Type Notes Gershon Crane - MD [phone] 07/12/2020 7:44:19 PM Paged On Call Back to Call Center Doctor Paged please call me at [phone]. ty Gershon Crane - MD 07/12/2020 8:09:57 PM Spoke with On Call -  General Message Result per on call  connect him to pt

## 2020-07-15 NOTE — Telephone Encounter (Signed)
Jack Lewis,  How soon can pt be scheduled with neurology?  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 Concern for need for surveillance and evaluation for idiopathic intracranial hypertension.  BP at home 140/ 87-90 HR 70 No tinnitus, pulsatile tinnitus, hearing or vision changes.  HA continues left sided , exertional. Over the weekend, had no HA as he was resting and not exercising. Had bowel movement today and HA returned.  Self increased lisinopril 15 mg 3 days ago from 10mg   Continue propranolol 20mg  BID Advised to increase to lisinopril 20mg .  Patient will call and move lab appt out to 07/19/20 Urgent referral to neurology placed.

## 2020-07-15 NOTE — Telephone Encounter (Signed)
Referral has to be reviewed it looks like it was given to a provider to review.

## 2020-07-15 NOTE — Telephone Encounter (Signed)
Called patient and offered an appt with Cadence Furth, PA for this week to evaluate BP as requested in the following MyChart message.  Longo, Patterson Hammersmith, MD 16 hours ago (6:28 PM)   My blood pressure has spiked over the past week. I had a STAT MRI Friday that showed two possible aneurysms. I'm tracking my blood pressure at home and I'm averaging 155/90 and I'm on 15mg  Lisinopril. I'm extremely concerned that I have the blood pressure peaking along with the possible aneurysms. My primary indicated that there is a need to get my blood pressure re-evaluated. Is there a way to make an emergency STAT appointment with Dr. ??

## 2020-07-16 ENCOUNTER — Ambulatory Visit: Payer: Managed Care, Other (non HMO) | Admitting: Family

## 2020-07-16 ENCOUNTER — Other Ambulatory Visit: Payer: Managed Care, Other (non HMO)

## 2020-07-16 ENCOUNTER — Other Ambulatory Visit: Payer: Self-pay

## 2020-07-16 ENCOUNTER — Encounter: Payer: Self-pay | Admitting: Family

## 2020-07-16 ENCOUNTER — Ambulatory Visit: Payer: Managed Care, Other (non HMO)

## 2020-07-16 DIAGNOSIS — G4733 Obstructive sleep apnea (adult) (pediatric): Secondary | ICD-10-CM

## 2020-07-16 NOTE — Telephone Encounter (Signed)
Neurology appt 3/31

## 2020-07-17 NOTE — Progress Notes (Signed)
Cardiology Office Note:    Date:  07/18/2020   ID:  Jack Lewis, DOB Sep 07, 1980, MRN [id number]  PCP:  [name], FNP  [facility] Cardiologist:  Debbe Odea, MD  [facility] Electrophysiologist:  None   Referring MD: [name], FNP   Chief Complaint: BP evaluation/severe headache  History of Present Illness:    Jack Lewis is a 40 y.o. male with a hx of HTN and HLD who presents for follow-up. The patient had prior work-up for chest pain with Echo and coronary CTA. Echo showed LVEF 60-65%, no WMA, normal diastolic parameters, trivial MR. Coronary CT showed coronary calcium score of 0 and no CAD.   Today, the patient reports 2 weeks ago he was at the gym and he had sudden onset of severe ad headache. It was left-sided and sharp. also had some throbbing in the forehead.  Had some disorientation and fuzziness. No chest pain, palpitations or sob. Had some weakness. He went home and checked his BP and it was high, 159/80s.  He went to the ER and BP was 172/87. CT head was unremarkable. Labs showed mild anemia, but otherwise unremarkable. They gave him IVF and benadryl, and compazine. Unsure if this helped, it just made him sleep. Saw PCP who increased lisinopril from 5 mg to 20mg  and added propranolol 20mg  BID.  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 Since the first episode it seems any type of exertion triggers the headache. He went back to the gym and only did light exercise. Has neurology appointment as well.   Father has history of headaches and MI. He is eating and drinking normally. Eats pretty healthy. He was very active, 1-2 hours a day. Just started biking. The day of the headache he was lifting heavy, possibly overexerting himself. He was hydrated and had eaten that day. No tobacco or alcohol use, drug use. 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   He has  been taking BPs at home after dinner before he takes is lisinopril. At home it's been around 138-140/84. No lower leg, orthopnea. Pnd, palpitations. No EKG from the ER, will obtain one today.   Past Medical History:  Diagnosis Date  . Allergy   . Hypertension   . Vertigo     Past Surgical History:  Procedure Laterality Date  . APPENDECTOMY    . VASECTOMY    . VASECTOMY REVERSAL      Current Medications: Current Meds  Medication Sig  . albuterol (VENTOLIN HFA) 108 (90 Base) MCG/ACT inhaler Inhale 1-2 puffs into the lungs every 6 (six) hours as needed for wheezing or shortness of breath.  . budesonide-formoterol (SYMBICORT) 80-4.5 MCG/ACT inhaler Inhale 2 puffs into the lungs 2 (two) times daily.  . fluticasone (FLONASE) 50 MCG/ACT nasal spray Place 2 sprays into both nostrils daily. Prn  . lisinopril (ZESTRIL) 20 MG tablet Take 1 tablet (20 mg total) by mouth daily.  loratadine (CLARITIN) 10 MG tablet Take 10 mg by mouth daily.  . montelukast (SINGULAIR) 10 MG tablet Take 1 tablet (10 mg total) by mouth at bedtime.  . traZODone (DESYREL) 50 MG tablet Take 0.5-1 tablets (25-50 mg total) by mouth at bedtime as needed.  . [DISCONTINUED] propranolol (INDERAL) 20 MG tablet Take 1 tablet (20 mg total) by mouth 2 (two) times daily.     Allergies:   Patient has no known allergies.  Social History   Socioeconomic History  . Marital status: Married    Spouse name: Not on file  . Number of children: 3  . Years of education: Not on file  . Highest education level: Not on file  Occupational History  . Not on file  Tobacco Use  . Smoking status: Never Smoker  . Smokeless tobacco: Never Used  Vaping Use  . Vaping Use: Never used  Substance and Sexual Activity  . Alcohol use: Not Currently    Alcohol/week: 0.0 standard drinks    Comment: occasional  . Drug use: No  . Sexual activity: Yes    Birth control/protection: Surgical  Other Topics Concern  . Not on file  Social  History Narrative   Married with 3 boys.    Works in [employer], more office job   Social Determinants of Corporate investment banker Strain: Not on [employer] Insecurity: Not on file  Transportation Needs: Not on file  Physical Activity: Not on file  Stress: Not on file  Social Connections: Not on file     Family History: The patient's family history includes Diabetes in his father; Heart attack (age of onset: 81) in his father; Heart attack (age of onset: 38) in his paternal grandfather; Hypertension in his father and mother.  ROS:   Please see the history of present illness.     All other systems reviewed and are negative.  EKGs/Labs/Other Studies Reviewed:    The following studies were reviewed today:  Echo 04/2020 1. Left ventricular ejection fraction, by estimation, is 60 to 65%. The  left ventricle has normal function. The left ventricle has no regional  wall motion abnormalities. The left ventricular internal cavity size was  mildly dilated. Left ventricular  diastolic parameters were normal.  2. Right ventricular systolic function is normal. The right ventricular  size is normal. There is normal pulmonary artery systolic pressure.  3. The mitral valve is normal in structure. Trivial mitral valve  regurgitation. No evidence of mitral stenosis.  4. The aortic valve is tricuspid. Aortic valve regurgitation is not  visualized. No aortic stenosis is present.  5. The inferior vena cava is normal in size with greater than 50%  respiratory variability, suggesting right atrial pressure of 3 mmHg.   Coronary CT 04/2020 IMPRESSION: 1. Coronary calcium score of 0. Patient is low risk for coronary events.  2. Normal coronary origin with right dominance.  3. No evidence of CAD.  4. CAD-RADS 0. No evidence of CAD (0%). Consider non-atherosclerotic causes of chest pain.  5. Image quality degraded by motion artifacts  Electronically Signed: By: Debbe Odea M.D. On: 05/02/2020 17:12   EKG:  EKG is ordered today.  The ekg ordered today demonstrates SB, 48bpm, LAD, nonspecific T wave abnormality, no sig change  Recent Labs: 03/06/2020: Magnesium 2.0; TSH 1.31 07/07/2020: ALT 70; BUN 9; Creatinine, Ser 0.96; Hemoglobin 12.4; Platelets 224; Potassium 3.6; Sodium 139  Recent Lipid Panel    Component Value Date/Time   CHOL 214 (H) 02/02/2020 1145   TRIG 52 02/02/2020 1145   HDL 58 02/02/2020 1145   CHOLHDL 3.7 02/02/2020 1145   LDLCALC 147 (H) 02/02/2020 1145    Physical Exam:    VS:  BP 122/88   Pulse (!) 54   Ht 5\' 7"  (1.702 m)   Wt 218 lb (98.9 kg)   BMI 34.14 kg/m     Wt Readings from Last 3 Encounters:  07/18/20 216  lb (98 kg)  07/18/20 218 lb (98.9 kg)  07/10/20 216 lb 12.8 oz (98.3 kg)     GEN:  Well nourished, well developed in no acute distress HEENT: Normal NECK: No JVD; No carotid bruits LYMPHATICS: No lymphadenopathy CARDIAC: RR, SB, no murmurs, rubs, gallops RESPIRATORY:  Clear to auscultation without rales, wheezing or rhonchi  ABDOMEN: Soft, non-tender, non-distended MUSCULOSKELETAL:  No edema; No deformity  SKIN: Warm and dry NEUROLOGIC:  Alert and oriented x 3 PSYCHIATRIC:  Normal affect   ASSESSMENT:    1. Essential hypertension   2. Hyperlipidemia, mixed   3. Acute nonintractable headache, unspecified headache type    PLAN:    In order of problems listed above:  Headache Sudden onset one-sided headache while working out last week, also noted high BP. No chest pain or sob. ER workup was relatively unremarkalbe. Headache still brought on with exertion. MRI head showed possibly 2 small aneuryms. BP meds have been adjusted by PCP and BP today is good. Recommended daily bps. I will decrease propranonol for bradycardia. He sees neurology later today, which I think will be helpful to discuss different types of headaches and treatment. Recent cardiac work-up with echo and Cardiac CT very  reassuring. He has no palpitations so no heart monitor. Unsure if severe headache caused elevated bp or visa versa. Recommended good hydration, low salt diet. Also, he might have been overexerting himself that day, recommended taking it easy.  Apparently father had a history of headaches needing injections.Will discuss MRA head finding with MD, will likely need maintenance imagine.  HTN BP significantly elevated in the setting of severe headache. BP up to 170s in the ER. Lisinopril increased from 5 mg to 20mg  daily. PCP also added propranolol 20mg  BID. BP today good, 122/88/ Heart rate on EKG showed 48bpm. Decrease propranolol to 20mg  daily. Also discussed checking daily bps.  Given increase in lisinopril would check BMET, however he says PCP is going to do labs next Monday. Can also consider TSH. We will see him back in 2 weeks to evaluate bp and heart rate. Can adjust medications at that time if needed.  HLD Continue statin. LDL 147 01/2020. Patient has made lifestyle changes. Can re-check at follow-up.   Disposition: Follow up in 2 week(s) with APP/MD    Signed, Gabriana Wilmott , PA-C  07/18/2020 10:33 AM     [facility]

## 2020-07-18 ENCOUNTER — Telehealth: Payer: Self-pay | Admitting: Cardiology

## 2020-07-18 ENCOUNTER — Other Ambulatory Visit: Payer: Self-pay

## 2020-07-18 ENCOUNTER — Telehealth: Payer: Self-pay

## 2020-07-18 ENCOUNTER — Ambulatory Visit: Payer: Managed Care, Other (non HMO) | Admitting: Medical

## 2020-07-18 ENCOUNTER — Encounter: Payer: Self-pay | Admitting: Medical

## 2020-07-18 ENCOUNTER — Encounter: Payer: Self-pay | Admitting: Neurology

## 2020-07-18 ENCOUNTER — Ambulatory Visit: Payer: Managed Care, Other (non HMO) | Admitting: Neurology

## 2020-07-18 VITALS — BP 120/71 | HR 50 | Ht 67.0 in | Wt 216.0 lb

## 2020-07-18 VITALS — BP 122/88 | HR 54 | Ht 67.0 in | Wt 218.0 lb

## 2020-07-18 DIAGNOSIS — I671 Cerebral aneurysm, nonruptured: Secondary | ICD-10-CM

## 2020-07-18 DIAGNOSIS — H538 Other visual disturbances: Secondary | ICD-10-CM | POA: Diagnosis not present

## 2020-07-18 DIAGNOSIS — I10 Essential (primary) hypertension: Secondary | ICD-10-CM | POA: Diagnosis not present

## 2020-07-18 DIAGNOSIS — G4484 Primary exertional headache: Secondary | ICD-10-CM | POA: Diagnosis not present

## 2020-07-18 DIAGNOSIS — E782 Mixed hyperlipidemia: Secondary | ICD-10-CM

## 2020-07-18 DIAGNOSIS — R001 Bradycardia, unspecified: Secondary | ICD-10-CM

## 2020-07-18 DIAGNOSIS — G4733 Obstructive sleep apnea (adult) (pediatric): Secondary | ICD-10-CM

## 2020-07-18 DIAGNOSIS — R519 Headache, unspecified: Secondary | ICD-10-CM | POA: Diagnosis not present

## 2020-07-18 DIAGNOSIS — E236 Other disorders of pituitary gland: Secondary | ICD-10-CM

## 2020-07-18 MED ORDER — INDOMETHACIN 50 MG PO CAPS: 50.0000 mg | ORAL_CAPSULE | Freq: Three times a day (TID) | ORAL | 0 refills | Status: DC | PRN

## 2020-07-18 MED ORDER — PROPRANOLOL HCL 20 MG PO TABS: 20.0000 mg | ORAL_TABLET | Freq: Every day | ORAL | 3 refills | Status: DC

## 2020-07-18 NOTE — Progress Notes (Signed)
Subjective:    Patient ID: Jack Lewis is a 40 y.o. male.  HPI     Jack Foley, MD, PhD [facility] [address], [address] [address] [place], [place] [address]  Dear Jack Lewis,   I saw your patient, Jack Lewis, upon your kind request in my neurologic clinic today for initial consultation of his recurrent headaches.  The patient is unaccompanied today.  As you know, Jack Lewis is a 40 year old right-handed gentleman with an underlying medical history of allergies, hypertension, vertigo, and obesity, who had recent onset of exertional headaches.  He reports headache started about 2 weeks ago.  It can last several minutes before it reduces to a milder headache but overall headache can last for hours. He was exercising when he first noticed a very severe headache.  His blood pressure was elevated.  This prompted him to go to the emergency room.  He presented to the ER on 07/07/2020 and I reviewed the records.  He was noted to have some blood pressure.  He had a head CT without contrast on 07/07/2020 and I reviewed the results:   IMPRESSION: Normal study. He was treated symptomatically with IV fluids, Compazine, Benadryl, and magnesium.  I reviewed your office note from 07/10/2020.-year-old F brain MRI as well as MR angiogram.  He had a brain MRI with and without contrast on 07/12/2020 and I reviewed the results: IMPRESSION: MRI brain:   1. No evidence of acute intracranial abnormality. 2. Partially empty sella turcica. This finding is very commonly incidental, but can be associated with idiopathic intracranial hypertension. 3. Otherwise unremarkable MRI appearance of the brain. 4. Paranasal sinus disease, as described. 5. T1 hypointense marrow signal within the visualized upper cervical spine. While these findings can reflect a marrow infiltrative process, the most common causes include chronic anemia, smoking and obesity. He also had a MR angiogram of the head on  07/12/2020 and I reviewed the results: MRA head:   1. 1-2 mm laterally projecting vascular protrusion arising from the cavernous left ICA suspicious for small aneurysm. 2. 1-2 mm inferiorly projecting vascular protrusion arising from the paraclinoid left ICA, which may reflect an aneurysm or infundibulum. 3. No intracranial large vessel occlusion or proximal high-grade arterial stenosis.    Since then, he had 1 more severe headache when he was straining in the bathroom and when he picks up his twins he also has exacerbation of his headache.  He has had no sudden onset of one-sided weakness or numbness or tingling or droopy face or slurring of speech, no light sensitivity, no nausea or vomiting but has had some fuzzy vision when his headache was severe.  He does not have any history of migraines, no family history of headache syndromes.  He does snore, he had a home sleep test a couple of days ago.  Results are not in yet.  He tries to hydrate well.  He typically does not drink caffeine and has not had any since the headache started.  He was taking 2 caffeine tablets in the mornings at the time.  He recently saw cardiology and was advised to reduce his propranolol to once daily because of bradycardia noted.  He was taking the propranolol 20 mg strength twice daily recently.  He has not had an eye exam in years.  He does not have a history of blurry vision, loss of vision, pain with eye movements.  His wife went ahead and made an appointment with an optometrist in [place],  [facility], this appointment is coming up in less than 2 weeks.  He does not drink alcohol frequently, he has not had any in the recent past.  He does not smoke.  He lives with his family including 68 year old son and 41-year-old twin boys.  His Past Medical History Is Significant For: Past Medical History:  Diagnosis Date  . Allergy   . Hypertension   . Vertigo     His Past Surgical History Is Significant For: Past  Surgical History:  Procedure Laterality Date  . APPENDECTOMY    . VASECTOMY    . VASECTOMY REVERSAL      His Family History Is Significant For: Family History  Problem Relation Age of Onset  . Hypertension Mother   . Diabetes Father   . Hypertension Father   . Heart attack Father 36  . Heart attack Paternal Grandfather 57    His Social History Is Significant For: Social History   Socioeconomic History  . Marital status: Married    Spouse name: Not on file  . Number of children: 3  . Years of education: Not on file  . Highest education level: Not on file  Occupational History  . Not on file  Tobacco Use  . Smoking status: Never Smoker  . Smokeless tobacco: Never Used  Vaping Use  . Vaping Use: Never used  Substance and Sexual Activity  . Alcohol use: Not Currently    Alcohol/week: 0.0 standard drinks    Comment: occasional  . Drug use: No  . Sexual activity: Yes    Birth control/protection: Surgical  Other Topics Concern  . Not on file  Social History Narrative   Married with 3 boys.    Works in [employer]ist, more office job   Social Determinants of Corporate investment banker Strain: Not on [employer] Insecurity: Not on file  Transportation Needs: Not on file  Physical Activity: Not on file  Stress: Not on file  Social Connections: Not on file    His Allergies Are:  No Known Allergies:   His Current Medications Are:  Outpatient Encounter Medications as of 07/18/2020  Medication Sig  . albuterol (VENTOLIN HFA) 108 (90 Base) MCG/ACT inhaler Inhale 1-2 puffs into the lungs every 6 (six) hours as needed for wheezing or shortness of breath.  . budesonide-formoterol (SYMBICORT) 80-4.5 MCG/ACT inhaler Inhale 2 puffs into the lungs 2 (two) times daily.  . fluticasone (FLONASE) 50 MCG/ACT nasal spray Place 2 sprays into both nostrils daily. Prn  . lisinopril (ZESTRIL) 20 MG tablet Take 1 tablet (20 mg total) by mouth daily.  Marland Kitchen loratadine (CLARITIN) 10 MG  tablet Take 10 mg by mouth daily.  . montelukast (SINGULAIR) 10 MG tablet Take 1 tablet (10 mg total) by mouth at bedtime.  . propranolol (INDERAL) 20 MG tablet Take 1 tablet (20 mg total) by mouth daily.  . traZODone (DESYREL) 50 MG tablet Take 0.5-1 tablets (25-50 mg total) by mouth at bedtime as needed.   No facility-administered encounter medications on file as of 07/18/2020.  :   Review of Systems:  Out of a complete 14 point review of systems, all are reviewed and negative with the exception of these symptoms as listed below:  Review of Systems  Neurological:       Here to discuss worsening h/a. Sts 2 sundays ago he was at the gym severe h/a came on while lifting weights . Since h/a dull present. Next day similar event took place.  Objective:  Neurological Exam  Physical Exam Physical Examination:   Vitals:   07/18/20 1017  BP: 120/71  Pulse: (!) 50    General Examination: The patient is a very pleasant 40 y.o. male in no acute distress. He appears well-developed and well-nourished and well groomed.   HEENT: Normocephalic, atraumatic, pupils are equal, round and reactive to light and accommodation. Funduscopic exam is normal with sharp disc margins noted. Extraocular tracking is good without limitation to gaze excursion or nystagmus noted. Normal smooth pursuit is noted. Visual fields are full by finger perimetry.  Hearing is grossly intact. Face is symmetric with normal facial animation and normal facial sensation. Speech is clear with no dysarthria noted. There is no hypophonia. There is no lip, neck/head, jaw or voice tremor. Neck is supple with full range of passive and active motion. There are no carotid bruits on auscultation. Oropharynx exam reveals: mild mouth dryness, good dental hygiene and moderate airway crowding. Tongue protrudes centrally and palate elevates symmetrically.  Chest: Clear to auscultation without wheezing, rhonchi or crackles noted.  Heart:  S1+S2+0, regular and normal without murmurs, rubs or gallops noted.   Abdomen: Soft, non-tender and non-distended with normal bowel sounds appreciated on auscultation.  Extremities: There is no pitting edema in the distal lower extremities bilaterally. Pedal pulses are intact.  Skin: Warm and dry without trophic changes noted.  Musculoskeletal: exam reveals no obvious joint deformities, tenderness or joint swelling or erythema.   Neurologically:  Mental status: The patient is awake, alert and oriented in all 4 spheres. His immediate and remote memory, attention, language skills and fund of knowledge are appropriate. There is no evidence of aphasia, agnosia, apraxia or anomia. Speech is clear with normal prosody and enunciation. Thought process is linear. Mood is normal and affect is normal.  Cranial nerves II - XII are as described above under HEENT exam. In addition: shoulder shrug is normal with equal shoulder height noted. Motor exam: Normal bulk, strength and tone is noted. There is no drift, tremor or rebound. Romberg is negative. Reflexes are 2+ throughout. Babinski: Toes are flexor bilaterally. Fine motor skills and coordination: intact with normal finger taps, normal hand movements, normal rapid alternating patting, normal foot taps and normal foot agility.  Cerebellar testing: No dysmetria or intention tremor on finger to nose testing. Heel to shin is unremarkable bilaterally. There is no truncal or gait ataxia.  Sensory exam: intact to light touch, vibration, temperature sense in the upper and lower extremities.  Gait, station and balance: He stands easily. No veering to one side is noted. No leaning to one side is noted. Posture is age-appropriate and stance is narrow based. Gait shows normal stride length and normal pace. No problems turning are noted. Tandem walk is unremarkable.  Assessment and Plan:  In summary, Jack Lewis is a very pleasant 40 y.o.-year old male with an  underlying medical history of allergies, hypertension, vertigo, and obesity, who presents for evaluation of his recent onset of exertional headaches.  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  We talked about management of exertional headache.  He is advised to stay well-hydrated, well rested, and continue with his beta-blocker which is a good option for preventative approach but he has had bradycardia from it and can only tolerate 20 mg daily.  We talked about his sleep apnea rest.  He had a recent home sleep test and is encouraged to find out about the results and consider treatment for sleep apnea if he qualifies.  In addition, would like to proceed with further evaluation through interventional radiology regarding his aneurysm.  He is agreeable to a referral to Dr. [name] and he may benefit from evaluation with a conventional angiogram.  For as needed use, I recommend a short course of indomethacin, 50 mg strength.  He can take up to 3 times a day and is advised to consider premedicating if he is planning to exercise but he is advised to avoid any strenuous exercise at this time.  He is advised to proceed with a referral to ophthalmology for full dilated eye exam as well as to check for papilledema.  On my exam he has a benign eye exam.  He has been advised regarding the incidental finding of partially empty sella.  This is typically a benign finding and incidental but can be associated with intracranial hypertension.  If he does have papilledema based on ophthalmological examination he is advised that we consider additional testing in the form of lumbar puncture to evaluate his spinal fluid pressure.  For now, he is advised to continue with the propranolol, follow-up regarding his sleep test, pursue  consultation with interventional radiology and  ophthalmology.  He is currently scheduled in a couple of weeks to see an optometrist.  We had an extensive discussion today.  This was an extended visit of over 60 min.   Thank you very much for allowing me to participate in the care of this nice patient. If I can be of any further assistance to you please do not hesitate to call me at [phone].  Sincerely,   Jack Foley, MD, PhD

## 2020-07-18 NOTE — Telephone Encounter (Signed)
-----   Message from Brigitte Pulse sent at 07/18/2020 12:29 PM EDT ----- Jack Lewis DOB [birth year] MRN [id number] HST is ready to be reviewed

## 2020-07-18 NOTE — Patient Instructions (Signed)
Medication Instructions:  Your physician has recommended you make the following change in your medication:   DECREASE Propranolol to 20mg  DAILY  *If you need a refill on your cardiac medications before your next appointment, please call your pharmacy*   Lab Work: None ordered   Testing/Procedures: None ordered   Follow-Up: At [facility], you and your health needs are our priority.  As part of our continuing mission to provide you with exceptional heart care, we have created designated Provider Care Teams.  These Care Teams include your primary Cardiologist (physician) and Advanced Practice Providers (APPs -  Physician Assistants and Nurse Practitioners) who all work together to provide you with the care you need, when you need it.  We recommend signing up for the patient portal called "MyChart".  Sign up information is provided on this After Visit Summary.  MyChart is used to connect with patients for Virtual Visits (Telemedicine).  Patients are able to view lab/test results, encounter notes, upcoming appointments, etc.  Non-urgent messages can be sent to your provider as well.   To learn more about what you can do with MyChart, go to [facility].    Your next appointment:   2 week(s)  The format for your next appointment:   In Person  Provider:   You may see [email or web address], MD or one of the following Advanced Practice Providers on your designated Care Team:    Debbe Odea, NP  Nicolasa Ducking, PA-C  Eula Listen, PA-C  Cadence Marisue Ivan, Fransico Michael  [place], NP    Other Instructions  Please monitor your blood pressure daily and use BP log below to bring with you to your next follow up appointment.   Tips to Measure your Blood Pressure Correctly  To determine whether you have hypertension, a medical professional will take a blood pressure reading. How you prepare for the test, the position of your arm, and other factors can change a blood pressure  reading by 10% or more. That could be enough to hide high blood pressure, start you on a drug you don't really need, or lead your doctor to incorrectly adjust your medications.  National and international guidelines offer specific instructions for measuring blood pressure. If a doctor, nurse, or medical assistant isn't doing it right, don't hesitate to ask him or her to get with the guidelines.  Here's what you can do to ensure a correct reading: . Don't drink a caffeinated beverage or smoke during the 30 minutes before the test. . Sit quietly for five minutes before the test begins. . During the measurement, sit in a chair with your feet on the floor and your arm supported so your elbow is at about heart level. . The inflatable part of the cuff should completely cover at least 80% of your upper arm, and the cuff should be placed on bare skin, not over a shirt. . Don't talk during the measurement. . Have your blood pressure measured twice, with a brief break in between. If the readings are different by 5 points or more, have it done a third time.  There are times to break these rules. If you sometimes feel lightheaded when getting out of bed in the morning or when you stand after sitting, you should have your blood pressure checked while seated and then while standing to see if it falls from one position to the next.  Because blood pressure varies throughout the day, your doctor will rarely diagnose hypertension on the basis of a single reading.  Instead, he or she will want to confirm the measurements on at least two occasions, usually within a few weeks of one another. The exception to this rule is if you have a blood pressure reading of 180/110 mm Hg or higher. A result this high usually calls for prompt treatment.  It's a good idea to have your blood pressure measured in both arms at least once, since the reading in one arm (usually the right) may be higher than that in the left. A 2014 study in  The American Journal of Medicine of nearly 3,400 people found average arm- to-arm differences in systolic blood pressure of about 5 points. The higher number should be used to make treatment decisions.  In 2017, new guidelines from the American Heart Association, the [employer] of Cardiology, and nine other health organizations lowered the diagnosis of high blood pressure to 130/80 mm Hg or higher for all adults. The guidelines also redefined the various blood pressure categories to now include normal, elevated, Stage 1 hypertension, Stage 2 hypertension, and hypertensive crisis (see "Blood pressure categories").  Blood pressure categories  Blood pressure category SYSTOLIC (upper number)  DIASTOLIC (lower number)  Normal Less than 120 mm Hg and Less than 80 mm Hg  Elevated 120-129 mm Hg and Less than 80 mm Hg  High blood pressure: Stage 1 hypertension 130-139 mm Hg or 80-89 mm Hg  High blood pressure: Stage 2 hypertension 140 mm Hg or higher or 90 mm Hg or higher  Hypertensive crisis (consult your doctor immediately) Higher than 180 mm Hg and/or Higher than 120 mm Hg  Source: American Heart Association and American Stroke Association. For more on getting your blood pressure under control, buy Controlling Your Blood Pressure, a Special Health Report from [facility].   Blood Pressure Log   Date   Time  Blood Pressure  Position  Example: Nov 1 9 AM 124/78 sitting

## 2020-07-18 NOTE — Telephone Encounter (Signed)
LMOV to schedule  

## 2020-07-18 NOTE — Telephone Encounter (Signed)
Sleep study has been reviewed by MD. Mild OSA AHI 8.2 with low spo2 of 88%. Recommendations are weight loss, cpap, oral appliance or surgical assessment.    Patient is aware of results and voiced his understanding. Patient would like to proceed with cpap.  Beth, please advise on settings? Thanks

## 2020-07-18 NOTE — Patient Instructions (Addendum)
It was nice to meet you both today, your neurological exam and eye exam looked benign today thankfully.  Nevertheless, I recommend few additional things.  I would like to get you evaluated with interventional radiologist, Dr. 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  Continue with the propranolol at low dose once daily.  For as needed use, also prior to mild exercise, you can use indomethacin 50 mg strength, take up to 3 times a day, this is only for short-term use, do not combine with Advil or ibuprofen.  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  If your home sleep test showed obstructive sleep apnea, even if it is mild, you may benefit from getting it treated.  Please inquire about your test results and further management.  Try to stay well-hydrated and well rested, avoid any strenuous exercise or lifting weights.  Avoid taking caffeine tablets.  If you have swelling of the eye nerves or one eye nerve, called papilledema, we may proceed with additional evaluation for intracranial hypertension meaning excess spinal fluid pressure around the brain, we will likely then consider a spinal tap.

## 2020-07-18 NOTE — Telephone Encounter (Signed)
-----   Message from Annia Belt, RN sent at 07/18/2020  9:34 AM EDT ----- Regarding: follow up This pt asks [place] to call him to schedule his 2 week follow up. He had to leave in a hurry this morning.   Thanks!

## 2020-07-19 ENCOUNTER — Other Ambulatory Visit: Payer: Self-pay | Admitting: Family

## 2020-07-19 DIAGNOSIS — I10 Essential (primary) hypertension: Secondary | ICD-10-CM

## 2020-07-23 NOTE — Telephone Encounter (Signed)
Auto CPAP 5-15cm h20. Reviewed

## 2020-07-23 NOTE — Telephone Encounter (Signed)
Order has been placed for cpap. Nothing further needed at this time.   

## 2020-07-24 ENCOUNTER — Other Ambulatory Visit (HOSPITAL_COMMUNITY): Payer: Self-pay | Admitting: Interventional Radiology

## 2020-07-24 ENCOUNTER — Telehealth (HOSPITAL_COMMUNITY): Payer: Self-pay

## 2020-07-24 DIAGNOSIS — G4484 Primary exertional headache: Secondary | ICD-10-CM

## 2020-07-24 DIAGNOSIS — E236 Other disorders of pituitary gland: Secondary | ICD-10-CM

## 2020-07-24 DIAGNOSIS — R519 Headache, unspecified: Secondary | ICD-10-CM

## 2020-07-24 DIAGNOSIS — I671 Cerebral aneurysm, nonruptured: Secondary | ICD-10-CM

## 2020-07-24 DIAGNOSIS — H538 Other visual disturbances: Secondary | ICD-10-CM

## 2020-07-24 NOTE — Telephone Encounter (Signed)
Called to schedule consult with Dr. Deveshwar, no answer, left vm. AW  

## 2020-07-25 ENCOUNTER — Telehealth: Payer: Self-pay | Admitting: Neurology

## 2020-07-25 ENCOUNTER — Other Ambulatory Visit: Payer: Self-pay

## 2020-07-25 DIAGNOSIS — R0600 Dyspnea, unspecified: Secondary | ICD-10-CM

## 2020-07-25 DIAGNOSIS — R0609 Other forms of dyspnea: Secondary | ICD-10-CM

## 2020-07-25 MED ORDER — BUDESONIDE-FORMOTEROL FUMARATE 80-4.5 MCG/ACT IN AERO: 2.0000 | INHALATION_SPRAY | Freq: Two times a day (BID) | RESPIRATORY_TRACT | 3 refills | Status: DC

## 2020-07-25 NOTE — Telephone Encounter (Signed)
I received records from [facility]  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  Records indicate no afferent pupillary defect, no pallor, no edema, no evidence of intracranial hypertension on eye exam.  He was seen by Dr. Inez Pilgrim.  Nothing further needed, FYI.

## 2020-07-31 ENCOUNTER — Other Ambulatory Visit (INDEPENDENT_AMBULATORY_CARE_PROVIDER_SITE_OTHER): Payer: Managed Care, Other (non HMO)

## 2020-07-31 ENCOUNTER — Other Ambulatory Visit: Payer: Self-pay

## 2020-07-31 DIAGNOSIS — I10 Essential (primary) hypertension: Secondary | ICD-10-CM | POA: Diagnosis not present

## 2020-07-31 DIAGNOSIS — R519 Headache, unspecified: Secondary | ICD-10-CM

## 2020-07-31 NOTE — Addendum Note (Signed)
Addended by: Hulan Fray on: 07/31/2020 10:26 AM   Modules accepted: Orders

## 2020-08-01 ENCOUNTER — Ambulatory Visit (HOSPITAL_COMMUNITY)
Admission: RE | Admit: 2020-08-01 | Discharge: 2020-08-01 | Disposition: A | Discharge status: Home / Self Care | Payer: Managed Care, Other (non HMO) | Source: Ambulatory Visit | Attending: Interventional Radiology | Admitting: Interventional Radiology

## 2020-08-01 DIAGNOSIS — G4484 Primary exertional headache: Secondary | ICD-10-CM

## 2020-08-01 DIAGNOSIS — R519 Headache, unspecified: Secondary | ICD-10-CM

## 2020-08-01 DIAGNOSIS — I671 Cerebral aneurysm, nonruptured: Secondary | ICD-10-CM

## 2020-08-01 DIAGNOSIS — E236 Other disorders of pituitary gland: Secondary | ICD-10-CM

## 2020-08-01 DIAGNOSIS — H538 Other visual disturbances: Secondary | ICD-10-CM

## 2020-08-01 LAB — CBC WITH DIFFERENTIAL/PLATELET
Basophils Absolute: 0 10*3/uL (ref 0.0–0.2)
Basos: 1 %
EOS (ABSOLUTE): 0.1 10*3/uL (ref 0.0–0.4)
Eos: 1 %
Hematocrit: 44.2 % (ref 37.5–51.0)
Hemoglobin: 14.1 g/dL (ref 13.0–17.7)
Immature Grans (Abs): 0 10*3/uL (ref 0.0–0.1)
Immature Granulocytes: 0 %
Lymphocytes Absolute: 2 10*3/uL (ref 0.7–3.1)
Lymphs: 32 %
MCH: 28.5 pg (ref 26.6–33.0)
MCHC: 31.9 g/dL (ref 31.5–35.7)
MCV: 90 fL (ref 79–97)
Monocytes Absolute: 0.3 10*3/uL (ref 0.1–0.9)
Monocytes: 5 %
Neutrophils Absolute: 3.8 10*3/uL (ref 1.4–7.0)
Neutrophils: 61 %
Platelets: 303 10*3/uL (ref 150–450)
RBC: 4.94 x10E6/uL (ref 4.14–5.80)
RDW: 12.5 % (ref 11.6–15.4)
WBC: 6.3 10*3/uL (ref 3.4–10.8)

## 2020-08-01 LAB — COMPREHENSIVE METABOLIC PANEL
ALT: 25 IU/L (ref 0–44)
AST: 26 IU/L (ref 0–40)
Albumin/Globulin Ratio: 1.8 (ref 1.2–2.2)
Albumin: 4.6 g/dL (ref 4.0–5.0)
Alkaline Phosphatase: 55 IU/L (ref 44–121)
BUN/Creatinine Ratio: 10 (ref 9–20)
BUN: 12 mg/dL (ref 6–20)
Bilirubin Total: 0.4 mg/dL (ref 0.0–1.2)
CO2: 23 mmol/L (ref 20–29)
Calcium: 9.9 mg/dL (ref 8.7–10.2)
Chloride: 97 mmol/L (ref 96–106)
Creatinine, Ser: 1.16 mg/dL (ref 0.76–1.27)
Globulin, Total: 2.5 g/dL (ref 1.5–4.5)
Glucose: 76 mg/dL (ref 65–99)
Potassium: 4.9 mmol/L (ref 3.5–5.2)
Sodium: 136 mmol/L (ref 134–144)
Total Protein: 7.1 g/dL (ref 6.0–8.5)
eGFR: 83 mL/min/{1.73_m2} (ref >59)

## 2020-08-01 LAB — TSH: TSH: 0.535 u[IU]/mL (ref 0.450–4.500)

## 2020-08-02 HISTORY — PX: IR RADIOLOGIST EVAL & MGMT: IMG5224

## 2020-08-06 ENCOUNTER — Other Ambulatory Visit: Payer: Self-pay

## 2020-08-06 ENCOUNTER — Encounter: Payer: Self-pay | Admitting: Family

## 2020-08-06 ENCOUNTER — Ambulatory Visit: Payer: Managed Care, Other (non HMO) | Admitting: Family

## 2020-08-06 VITALS — BP 114/72 | HR 81 | Temp 98.3°F | Resp 16 | Ht 67.0 in | Wt 207.8 lb

## 2020-08-06 DIAGNOSIS — G4484 Primary exertional headache: Secondary | ICD-10-CM | POA: Diagnosis not present

## 2020-08-06 DIAGNOSIS — I10 Essential (primary) hypertension: Secondary | ICD-10-CM

## 2020-08-06 MED ORDER — LISINOPRIL 10 MG PO TABS: 10.0000 mg | ORAL_TABLET | Freq: Every day | ORAL | 1 refills | Status: DC

## 2020-08-06 NOTE — Assessment & Plan Note (Signed)
Controlled. Continue lisinopril 10mg 

## 2020-08-06 NOTE — Progress Notes (Signed)
Subjective:    Patient ID: Jack Lewis, male    DOB: 1980-07-27, 40 y.o.   MRN: [id number]  CC: Jack Lewis is a 40 y.o. male who presents today for follow up.   HPI: Feels well today NO new complaints.   HTN- no longer on lisinopril 20mg  and he has started 10mg . Blood pressure at home 120/70. No cp.   HA- No HA in the past 2 weeks. He has resumed exercise, working in yard with no recurrence.  he has stopped the propranolol due to bradycardia.    has seen Jack 07/18/20 regarding exertional HA. Recommended indomethacin 50mg  prior to exercise.He has not picked this medication up.  Recommended Eye exam and sleep study. Referred to [facility] IR for evaluation of aneurysms whom he saw last week 08/01/20 whom advised diagnostic arteriogram.    HISTORY:  Past Medical History:  Diagnosis Date  . Allergy   . Hypertension   . Vertigo    Past Surgical History:  Procedure Laterality Date  . APPENDECTOMY    . IR RADIOLOGIST EVAL & MGMT  08/02/2020  . VASECTOMY    . VASECTOMY REVERSAL     Family History  Problem Relation Age of Onset  . Hypertension Mother   . Diabetes Father   . Hypertension Father   . Heart attack Father 92  . Heart attack Paternal Grandfather 32    Allergies: Patient has no known allergies. Current Outpatient Medications on File Prior to Visit  Medication Sig Dispense Refill  . fluticasone (FLONASE) 50 MCG/ACT nasal spray Place 2 sprays into both nostrils daily. Prn 16 g 11  . loratadine (CLARITIN) 10 MG tablet Take 10 mg by mouth daily.    . montelukast (SINGULAIR) 10 MG tablet Take 1 tablet (10 mg total) by mouth at bedtime. 30 tablet 2  . traZODone (DESYREL) 50 MG tablet Take 0.5-1 tablets (25-50 mg total) by mouth at bedtime as needed. 30 tablet 3   No current facility-administered medications on file prior to visit.    Social History   Tobacco Use  . Smoking status: Never Smoker  . Smokeless tobacco: Never Used  Vaping Use  . Vaping  Use: Never used  Substance Use Topics  . Alcohol use: Not Currently    Alcohol/week: 0.0 standard drinks    Comment: occasional  . Drug use: No    Review of Systems  Constitutional: Negative for chills and fever.  Respiratory: Negative for cough.   Cardiovascular: Negative for chest pain and palpitations.  Gastrointestinal: Negative for nausea and vomiting.  Neurological: Negative for headaches.      Objective:    BP 114/72 (BP Location: Left Arm, Patient Position: Sitting, Cuff Size: Large)   Pulse 81   Temp 98.3 F (36.8 C) (Oral)   Resp 16   Ht 5\' 7"  (1.702 m)   Wt 207 lb 12.8 oz (94.3 kg)   SpO2 97%   BMI 32.55 kg/m  BP Readings from Last 3 Encounters:  08/06/20 114/72  07/18/20 120/71  07/18/20 122/88   Wt Readings from Last 3 Encounters:  08/06/20 207 lb 12.8 oz (94.3 kg)  07/18/20 216 lb (98 kg)  07/18/20 218 lb (98.9 kg)    Physical Exam Vitals reviewed.  Constitutional:      Appearance: He is well-developed.  Cardiovascular:     Rate and Rhythm: Regular rhythm.     Heart sounds: Normal heart sounds.  Pulmonary:     Effort: Pulmonary effort is  normal. No respiratory distress.     Breath sounds: Normal breath sounds. No wheezing, rhonchi or rales.  Skin:    General: Skin is warm and dry.  Neurological:     Mental Status: He is alert.  Psychiatric:        Speech: Speech normal.        Behavior: Behavior normal.        Assessment & Plan:   Problem List Items Addressed This Visit      Cardiovascular and Mediastinum   Hypertension - Primary    Controlled. Continue lisinopril 10mg .      Relevant Medications   lisinopril (ZESTRIL) 10 MG tablet     Other   Exertional headache    No recurrence. Pending diagnostic arteriogram with Jack IR to confirm diagnosis of aneurysm. Will follow.            I have discontinued Jack Lewis. Jack Lewis's albuterol, lisinopril, propranolol, indomethacin, and budesonide-formoterol. I have also changed  his lisinopril. Additionally, I am having him maintain his loratadine, fluticasone, montelukast, and traZODone.   Meds ordered this encounter  Medications  . lisinopril (ZESTRIL) 10 MG tablet    Sig: Take 1 tablet (10 mg total) by mouth daily.    Dispense:  90 tablet    Refill:  1    Order Specific Question:   Supervising Provider    Answer:   Jack Lewis [[id number]]    Return precautions given.   Risks, benefits, and alternatives of the medications and treatment plan prescribed today were discussed, and patient expressed understanding.   Education regarding symptom management and diagnosis given to patient on AVS.  Continue to follow with Jack Shams, FNP for routine health maintenance.   Jack Lewis and I agreed with plan.   Jack Moan, FNP

## 2020-08-06 NOTE — Patient Instructions (Signed)
Nice to see you!   

## 2020-08-06 NOTE — Assessment & Plan Note (Addendum)
No recurrence. Pending diagnostic arteriogram with Dr [name] IR to confirm diagnosis of aneurysm. Will follow.

## 2020-08-08 ENCOUNTER — Other Ambulatory Visit (HOSPITAL_COMMUNITY): Payer: Self-pay | Admitting: Interventional Radiology

## 2020-08-08 DIAGNOSIS — I671 Cerebral aneurysm, nonruptured: Secondary | ICD-10-CM

## 2020-08-08 DIAGNOSIS — H538 Other visual disturbances: Secondary | ICD-10-CM

## 2020-08-14 ENCOUNTER — Other Ambulatory Visit: Payer: Self-pay | Admitting: *Deleted

## 2020-08-14 MED ORDER — MONTELUKAST SODIUM 10 MG PO TABS: 10.0000 mg | ORAL_TABLET | Freq: Every day | ORAL | 5 refills | Status: DC

## 2020-08-29 ENCOUNTER — Other Ambulatory Visit: Payer: Self-pay | Admitting: Student

## 2020-08-29 ENCOUNTER — Other Ambulatory Visit: Payer: Self-pay | Admitting: Radiology

## 2020-08-29 ENCOUNTER — Telehealth (HOSPITAL_COMMUNITY): Payer: Self-pay

## 2020-08-29 NOTE — Telephone Encounter (Signed)
Called to reschedule angiogram for tomorrow, no answer, left vm. AW

## 2020-08-30 ENCOUNTER — Ambulatory Visit (HOSPITAL_COMMUNITY): Admission: RE | Admit: 2020-08-30 | Payer: Managed Care, Other (non HMO) | Source: Ambulatory Visit

## 2020-08-30 ENCOUNTER — Encounter (HOSPITAL_COMMUNITY): Payer: Self-pay

## 2020-09-03 ENCOUNTER — Telehealth: Payer: Self-pay

## 2020-09-03 NOTE — Telephone Encounter (Addendum)
I called patient due to refill [facility] for propanolol. He stated that he was no longer taking this & had taken himself off the Lisinopril 10 mg due to his BP running so low. He stated that he say as low as 85/50's. He was feeling terrible & stopped taking. He monitors BP frequently & numbers have mostly been 70's-80 diastolic to 120's systolic. He has also lost 15 lbs.  In light of this patient scheduled for 7/19 & moved up appointment to 6/3.

## 2020-09-06 NOTE — Telephone Encounter (Signed)
Thanks for moving up Off of the propranolol, BP running now 120/70-80.  Is weight loss intentional?   Please ensure that he doesn't feel he needs to be seen sooner however I agree with stopping propranolol in light of very low BP and weight loss which will further lower BP.

## 2020-09-06 NOTE — Telephone Encounter (Signed)
Just FYI Patient was trying to lose weight & he wanted to wait on appointment until after he had the IR ANGIO done 5/27 to f/u with you.

## 2020-09-11 ENCOUNTER — Other Ambulatory Visit: Payer: Self-pay | Admitting: Radiology

## 2020-09-12 ENCOUNTER — Other Ambulatory Visit: Payer: Self-pay | Admitting: Radiology

## 2020-09-13 ENCOUNTER — Ambulatory Visit (HOSPITAL_COMMUNITY): Admission: RE | Admit: 2020-09-13 | Payer: Managed Care, Other (non HMO) | Source: Ambulatory Visit

## 2020-09-18 ENCOUNTER — Other Ambulatory Visit: Payer: Self-pay | Admitting: Student

## 2020-09-19 ENCOUNTER — Telehealth (HOSPITAL_COMMUNITY): Payer: Self-pay

## 2020-09-19 ENCOUNTER — Ambulatory Visit (HOSPITAL_COMMUNITY): Admission: RE | Admit: 2020-09-19 | Payer: Managed Care, Other (non HMO) | Source: Ambulatory Visit

## 2020-09-19 ENCOUNTER — Encounter (HOSPITAL_COMMUNITY): Payer: Self-pay

## 2020-09-19 NOTE — Telephone Encounter (Signed)
Called to reschedule angio, no answer, left vm. AW 

## 2020-09-20 ENCOUNTER — Ambulatory Visit: Payer: Managed Care, Other (non HMO) | Admitting: Family

## 2020-09-20 ENCOUNTER — Encounter: Payer: Self-pay | Admitting: Family

## 2020-09-20 ENCOUNTER — Other Ambulatory Visit: Payer: Self-pay

## 2020-09-20 VITALS — BP 160/86 | HR 104 | Temp 96.9°F | Ht 67.0 in | Wt 205.4 lb

## 2020-09-20 DIAGNOSIS — I10 Essential (primary) hypertension: Secondary | ICD-10-CM

## 2020-09-20 DIAGNOSIS — J302 Other seasonal allergic rhinitis: Secondary | ICD-10-CM

## 2020-09-20 DIAGNOSIS — G4700 Insomnia, unspecified: Secondary | ICD-10-CM

## 2020-09-20 MED ORDER — LISINOPRIL 5 MG PO TABS: 2.5000 mg | ORAL_TABLET | Freq: Every day | ORAL | 3 refills | Status: DC

## 2020-09-20 MED ORDER — AZELASTINE HCL 0.1 % NA SOLN
1.0000 | Freq: Two times a day (BID) | NASAL | 4 refills | Status: DC
Start: 2020-09-20 — End: 2022-06-10

## 2020-09-20 NOTE — Assessment & Plan Note (Signed)
Uncontrolled. Trial azelastine. Continue claritin and Singulair.

## 2020-09-20 NOTE — Patient Instructions (Signed)
Start azelastine  Start lisinopril 2.5mg  It is imperative that you are seen AT least twice per year for labs and monitoring. Monitor blood pressure at home and me 5-6 reading on separate days. Goal is less than 120/80, based on newest guidelines, however we certainly want to be less than 130/80;  if persistently higher, please make sooner follow up appointment so we can recheck you blood pressure and manage/ adjust medications.  Labs in one week

## 2020-09-20 NOTE — Assessment & Plan Note (Signed)
Elevated in setting of recent weight gain. Blood pressure is labile and we discussed this. Due to this , we will start lisinopril 2.5mg  and titrate as needed.

## 2020-09-20 NOTE — Assessment & Plan Note (Signed)
Controlled. Continue trazodone 50mg 

## 2020-09-20 NOTE — Progress Notes (Signed)
Subjective:    Patient ID: Jack Lewis, male    DOB: 1980/06/21, 40 y.o.   MRN: [id number]  CC: Jack Lewis is a 40 y.o. male who presents today for follow up.   HPI: HTN- he stopped lisinopril 5mg  about 4 weeks ago after BP at home 92/70. No CP, HA, syncope.   At home, blood pressure started 'creeping up' 125-130/ 80. Following low carb diet and endorses recent dietary indiscretion. Recent 7 lb weight gain.   Arteriogram with Dr 01-30-1981 , IR is to be rescheduled.   Sleeping well on trazodone 50mg .   Compliant with claritin and singulair. He has had more sneezing, itchy watery eyes.  No fever.   Feels that regimen is less effective than it has been in the past.      HISTORY:  Past Medical History:  Diagnosis Date  . Allergy   . Hypertension   . Vertigo    Past Surgical History:  Procedure Laterality Date  . APPENDECTOMY    . IR RADIOLOGIST EVAL & MGMT  08/02/2020  . VASECTOMY    . VASECTOMY REVERSAL     Family History  Problem Relation Age of Onset  . Hypertension Mother   . Diabetes Father   . Hypertension Father   . Heart attack Father 25  . Heart attack Paternal Grandfather 90    Allergies: Patient has no known allergies. Current Outpatient Medications on File Prior to Visit  Medication Sig Dispense Refill  . loratadine (CLARITIN) 10 MG tablet Take 10 mg by mouth daily.    . montelukast (SINGULAIR) 10 MG tablet Take 1 tablet (10 mg total) by mouth at bedtime. 30 tablet 5  . traZODone (DESYREL) 50 MG tablet Take 0.5-1 tablets (25-50 mg total) by mouth at bedtime as needed. (Patient taking differently: Take 50 mg by mouth at bedtime.) 30 tablet 3   No current facility-administered medications on file prior to visit.    Social History   Tobacco Use  . Smoking status: Never Smoker  . Smokeless tobacco: Never Used  Vaping Use  . Vaping Use: Never used  Substance Use Topics  . Alcohol use: Not Currently    Alcohol/week: 0.0 standard drinks     Comment: occasional  . Drug use: No    Review of Systems  Constitutional: Negative for chills and fever.  HENT: Negative for congestion.   Respiratory: Negative for cough.   Cardiovascular: Negative for chest pain and palpitations.  Gastrointestinal: Negative for nausea and vomiting.      Objective:    BP (!) 160/86   Pulse (!) 104   Temp (!) 96.9 F (36.1 C) (Temporal)   Ht 5\' 7"  (1.702 m)   Wt 205 lb 6.4 oz (93.2 kg)   SpO2 98%   BMI 32.17 kg/m  BP Readings from Last 3 Encounters:  09/20/20 (!) 160/86  08/06/20 114/72  07/18/20 120/71   Wt Readings from Last 3 Encounters:  09/20/20 205 lb 6.4 oz (93.2 kg)  08/06/20 207 lb 12.8 oz (94.3 kg)  07/18/20 216 lb (98 kg)    Physical Exam Vitals reviewed.  Constitutional:      Appearance: He is well-developed.  Cardiovascular:     Rate and Rhythm: Regular rhythm.     Heart sounds: Normal heart sounds.  Pulmonary:     Effort: Pulmonary effort is normal. No respiratory distress.     Breath sounds: Normal breath sounds. No wheezing, rhonchi or rales.  Skin:  General: Skin is warm and dry.  Neurological:     Mental Status: He is alert.  Psychiatric:        Speech: Speech normal.        Behavior: Behavior normal.        Assessment & Plan:   Problem List Items Addressed This Visit      Cardiovascular and Mediastinum   Hypertension - Primary    Elevated in setting of recent weight gain. Blood pressure is labile and we discussed this. Due to this , we will start lisinopril 2.5mg  and titrate as needed.       Relevant Medications   lisinopril (ZESTRIL) 5 MG tablet   azelastine (ASTELIN) 0.1 % nasal spray   Other Relevant Orders   Basic metabolic panel     Other   Insomnia    Controlled. Continue trazodone 50mg .       Seasonal allergies    Uncontrolled. Trial azelastine. Continue claritin and Singulair.         Relevant Medications   lisinopril (ZESTRIL) 5 MG tablet       I have discontinued  Jack Lewis's lisinopril. I am also having him start on lisinopril and azelastine. Additionally, I am having him maintain his loratadine, traZODone, and montelukast.   Meds ordered this encounter  Medications  . lisinopril (ZESTRIL) 5 MG tablet    Sig: Take 0.5 tablets (2.5 mg total) by mouth daily.    Dispense:  90 tablet    Refill:  3    Order Specific Question:   Supervising Provider    Answer:   Jack Lewis [[id number]]  . azelastine (ASTELIN) 0.1 % nasal spray    Sig: Place 1 spray into both nostrils 2 (two) times daily. Use in each nostril as directed    Dispense:  30 mL    Refill:  4    Order Specific Question:   Supervising Provider    Answer:   Jack Lewis [[id number]]    Return precautions given.   Risks, benefits, and alternatives of the medications and treatment plan prescribed today were discussed, and patient expressed understanding.   Education regarding symptom management and diagnosis given to patient on AVS.  Continue to follow with Jack Shams, FNP for routine health maintenance.   Jack Lewis and I agreed with plan.   Jack Moan, FNP

## 2020-09-26 ENCOUNTER — Other Ambulatory Visit: Payer: Managed Care, Other (non HMO)

## 2020-09-30 NOTE — Telephone Encounter (Signed)
Attempted to schedule. Lmov  

## 2020-10-02 ENCOUNTER — Telehealth (HOSPITAL_COMMUNITY): Payer: Self-pay

## 2020-10-02 NOTE — Telephone Encounter (Signed)
Called to reschedule angio, no answer, left vm. AW 

## 2020-10-24 ENCOUNTER — Ambulatory Visit: Payer: Managed Care, Other (non HMO) | Admitting: Neurology

## 2020-10-25 NOTE — Telephone Encounter (Signed)
Spoke with patient  Not ready to schedule follow up

## 2020-10-28 ENCOUNTER — Other Ambulatory Visit: Payer: Self-pay | Admitting: Family

## 2020-11-05 ENCOUNTER — Ambulatory Visit: Payer: Managed Care, Other (non HMO) | Admitting: Family

## 2020-12-05 ENCOUNTER — Telehealth: Payer: Self-pay | Admitting: Family

## 2020-12-05 NOTE — Telephone Encounter (Signed)
Patient is calling to see if he will have labs done at his appointment on Monday and if he will have to fast before labs.He would like for a message to be sent through his MyChart with this information.

## 2020-12-09 ENCOUNTER — Encounter: Payer: Self-pay | Admitting: Family

## 2020-12-09 ENCOUNTER — Ambulatory Visit: Payer: Managed Care, Other (non HMO) | Admitting: Family

## 2020-12-09 ENCOUNTER — Other Ambulatory Visit: Payer: Self-pay

## 2020-12-09 VITALS — BP 118/76 | HR 69 | Temp 98.0°F | Ht 66.54 in | Wt 205.8 lb

## 2020-12-09 DIAGNOSIS — G4484 Primary exertional headache: Secondary | ICD-10-CM

## 2020-12-09 DIAGNOSIS — T7840XD Allergy, unspecified, subsequent encounter: Secondary | ICD-10-CM

## 2020-12-09 DIAGNOSIS — I10 Essential (primary) hypertension: Secondary | ICD-10-CM | POA: Diagnosis not present

## 2020-12-09 DIAGNOSIS — J302 Other seasonal allergic rhinitis: Secondary | ICD-10-CM

## 2020-12-09 DIAGNOSIS — Z0000 Encounter for general adult medical examination without abnormal findings: Secondary | ICD-10-CM

## 2020-12-09 LAB — LIPID PANEL
Cholesterol: 211 mg/dL — ABNORMAL HIGH (ref 0–200)
HDL: 49.7 mg/dL (ref >39.00)
LDL Cholesterol: 152 mg/dL — ABNORMAL HIGH (ref 0–99)
NonHDL: 160.97
Total CHOL/HDL Ratio: 4
Triglycerides: 45 mg/dL (ref 0.0–149.0)
VLDL: 9 mg/dL (ref 0.0–40.0)

## 2020-12-09 LAB — COMPREHENSIVE METABOLIC PANEL
ALT: 18 U/L (ref 0–53)
AST: 22 U/L (ref 0–37)
Albumin: 4.4 g/dL (ref 3.5–5.2)
Alkaline Phosphatase: 64 U/L (ref 39–117)
BUN: 15 mg/dL (ref 6–23)
CO2: 29 mEq/L (ref 19–32)
Calcium: 9.9 mg/dL (ref 8.4–10.5)
Chloride: 101 mEq/L (ref 96–112)
Creatinine, Ser: 1.25 mg/dL (ref 0.40–1.50)
GFR: 72.92 mL/min (ref >60.00)
Glucose, Bld: 77 mg/dL (ref 70–99)
Potassium: 4.6 mEq/L (ref 3.5–5.1)
Sodium: 137 mEq/L (ref 135–145)
Total Bilirubin: 0.6 mg/dL (ref 0.2–1.2)
Total Protein: 7 g/dL (ref 6.0–8.3)

## 2020-12-09 LAB — HEMOGLOBIN A1C: Hgb A1c MFr Bld: 5.3 % (ref 4.6–6.5)

## 2020-12-09 MED ORDER — MONTELUKAST SODIUM 10 MG PO TABS: 10.0000 mg | ORAL_TABLET | Freq: Every day | ORAL | 3 refills | Status: DC

## 2020-12-09 MED ORDER — LISINOPRIL 5 MG PO TABS: 15.0000 mg | ORAL_TABLET | Freq: Every day | ORAL | 3 refills | Status: DC

## 2020-12-09 NOTE — Assessment & Plan Note (Signed)
Advised the importance of completing evaluation for aneurysm with IR, Dr. [name].  He is aware of this and politely declines at this time.

## 2020-12-09 NOTE — Assessment & Plan Note (Signed)
Chronic, stable.  Continue lisinopril 15 mg

## 2020-12-09 NOTE — Assessment & Plan Note (Signed)
Chronic , stable. Compliant with Singulair.  Continue regimen

## 2020-12-09 NOTE — Progress Notes (Signed)
Subjective:    Patient ID: Jack Lewis, male    DOB: 1981-03-06, 40 y.o.   MRN: [id number]  CC: [name] is a 40 y.o. male who presents today for follow up.   HPI: Feels well today.  No complaints  He would like fasting labs done for his biometric form for insurance.   Allergies are well controlled with Singulair.  He like to continue this regimen  compliant with lisinopril 51m  At home BP, 120/70.   HAs have resolved. He has canceled IR angio intracranial with Dr DEstanislado Pandy He doesn't want to pursue this test any further.  He is exercising without any headache.  Denies exertional chest pain or pressure, numbness or tingling radiating to left arm or jaw, palpitations, dizziness, changes in vision, or shortness of breath.   HISTORY:  Past Medical History:  Diagnosis Date   Allergy    Hypertension    Vertigo    Past Surgical History:  Procedure Laterality Date   APPENDECTOMY     IR RADIOLOGIST EVAL & MGMT  08/02/2020   VASECTOMY     VASECTOMY REVERSAL     Family History  Problem Relation Age of Onset   Hypertension Mother    Diabetes Father    Hypertension Father    Heart attack Father 656  Heart attack Paternal Grandfather 787   Allergies: Patient has no known allergies. Current Outpatient Medications on File Prior to Visit  Medication Sig Dispense Refill   azelastine (ASTELIN) 0.1 % nasal spray Place 1 spray into both nostrils 2 (two) times daily. Use in each nostril as directed 30 mL 4   loratadine (CLARITIN) 10 MG tablet Take 10 mg by mouth daily. (Patient not taking: Reported on 12/09/2020)     No current facility-administered medications on file prior to visit.    Social History   Tobacco Use   Smoking status: Never   Smokeless tobacco: Never  Vaping Use   Vaping Use: Never used  Substance Use Topics   Alcohol use: Not Currently    Alcohol/week: 0.0 standard drinks    Comment: occasional   Drug use: No    Review of Systems   Constitutional:  Negative for chills and fever.  Respiratory:  Negative for cough.   Cardiovascular:  Negative for chest pain and palpitations.  Gastrointestinal:  Negative for nausea and vomiting.  Neurological:  Negative for headaches.     Objective:    BP 118/76   Pulse 69   Temp 98 F (36.7 C)   Ht 5' 6.54" (1.69 m)   Wt 205 lb 12.8 oz (93.4 kg)   SpO2 97%   BMI 32.68 kg/m  BP Readings from Last 3 Encounters:  12/09/20 118/76  09/20/20 (!) 160/86  08/06/20 114/72   Wt Readings from Last 3 Encounters:  12/09/20 205 lb 12.8 oz (93.4 kg)  09/20/20 205 lb 6.4 oz (93.2 kg)  08/06/20 207 lb 12.8 oz (94.3 kg)    Physical Exam Vitals reviewed.  Constitutional:      Appearance: He is well-developed.  Cardiovascular:     Rate and Rhythm: Regular rhythm.     Heart sounds: Normal heart sounds.  Pulmonary:     Effort: Pulmonary effort is normal. No respiratory distress.     Breath sounds: Normal breath sounds. No wheezing, rhonchi or rales.  Skin:    General: Skin is warm and dry.  Neurological:     Mental Status: He is alert.  Psychiatric:  Speech: Speech normal.        Behavior: Behavior normal.       Assessment & Plan:   Problem List Items Addressed This Visit       Cardiovascular and Mediastinum   Hypertension    Chronic, stable.  Continue lisinopril 15 mg      Relevant Medications   lisinopril (ZESTRIL) 5 MG tablet     Other   Exertional headache    Advised the importance of completing evaluation for aneurysm with IR, Dr. Estanislado Pandy.  He is aware of this and politely declines at this time.      Seasonal allergies    Chronic , stable. Compliant with Singulair.  Continue regimen      Relevant Medications   lisinopril (ZESTRIL) 5 MG tablet   Other Visit Diagnoses     Health maintenance examination    -  Primary   Relevant Orders   Lipid panel   Comp Met (CMET)   HgB A1c   SARS-CoV-2 Semi-Quantitative Total Antibody, Spike   Allergy,  subsequent encounter       Relevant Medications   montelukast (SINGULAIR) 10 MG tablet        I have discontinued Marland Kitchen E. Romig's traZODone. I have also changed his lisinopril. Additionally, I am having him maintain his loratadine, azelastine, and montelukast.   Meds ordered this encounter  Medications   lisinopril (ZESTRIL) 5 MG tablet    Sig: Take 3 tablets (15 mg total) by mouth daily.    Dispense:  180 tablet    Refill:  3    Order Specific Question:   Supervising Provider    Answer:   Deborra Medina L [[id number]]   montelukast (SINGULAIR) 10 MG tablet    Sig: Take 1 tablet (10 mg total) by mouth at bedtime.    Dispense:  90 tablet    Refill:  3    Order Specific Question:   Supervising Provider    Answer:   Crecencio Mc [[id number]]     Return precautions given.   Risks, benefits, and alternatives of the medications and treatment plan prescribed today were discussed, and patient expressed understanding.   Education regarding symptom management and diagnosis given to patient on AVS.  Continue to follow with Burnard Hawthorne, FNP for routine health maintenance.   Terisa Starr and I agreed with plan.   [name], FNP

## 2020-12-11 LAB — SARS-COV-2 SEMI-QUANTITATIVE TOTAL ANTIBODY, SPIKE: SARS COV2 AB, Total Spike Semi QN: 2440 U/mL — ABNORMAL HIGH (ref <0.8)

## 2020-12-13 ENCOUNTER — Telehealth: Payer: Self-pay

## 2020-12-13 NOTE — Telephone Encounter (Signed)
Patient came in and signed form,it is upfront in Margaret's color folder.

## 2020-12-13 NOTE — Telephone Encounter (Signed)
Placed pt Biometrics form in folder up front for pt signature.

## 2020-12-16 NOTE — Telephone Encounter (Signed)
Form picked up and faxed on 8/26

## 2020-12-24 ENCOUNTER — Ambulatory Visit: Payer: Managed Care, Other (non HMO) | Admitting: Family

## 2021-02-18 ENCOUNTER — Encounter: Payer: Self-pay | Admitting: Family

## 2021-02-18 ENCOUNTER — Other Ambulatory Visit: Payer: Self-pay

## 2021-02-18 DIAGNOSIS — I10 Essential (primary) hypertension: Secondary | ICD-10-CM

## 2021-02-18 MED ORDER — LISINOPRIL 40 MG PO TABS: 40.0000 mg | ORAL_TABLET | Freq: Every day | ORAL | 3 refills | Status: DC

## 2021-02-19 NOTE — Telephone Encounter (Signed)
I tried to call patient & LMTCB. I had called [facility] tried to make patient f/u appointment based on [facility] provider discharge notes. They will not see patient until he pays off his bad debt.

## 2021-02-26 ENCOUNTER — Other Ambulatory Visit (INDEPENDENT_AMBULATORY_CARE_PROVIDER_SITE_OTHER): Payer: Managed Care, Other (non HMO)

## 2021-02-26 ENCOUNTER — Other Ambulatory Visit: Payer: Self-pay

## 2021-02-26 ENCOUNTER — Other Ambulatory Visit: Payer: Self-pay | Admitting: Radiology

## 2021-02-26 DIAGNOSIS — I10 Essential (primary) hypertension: Secondary | ICD-10-CM

## 2021-02-26 LAB — BASIC METABOLIC PANEL
BUN: 12 mg/dL (ref 6–23)
CO2: 31 mEq/L (ref 19–32)
Calcium: 9.9 mg/dL (ref 8.4–10.5)
Chloride: 102 mEq/L (ref 96–112)
Creatinine, Ser: 1.28 mg/dL (ref 0.40–1.50)
GFR: 70.76 mL/min (ref >60.00)
Glucose, Bld: 101 mg/dL — ABNORMAL HIGH (ref 70–99)
Potassium: 4.4 mEq/L (ref 3.5–5.1)
Sodium: 137 mEq/L (ref 135–145)

## 2021-02-26 NOTE — H&P (Signed)
Chief Complaint: Intracranial aneurysms  Referring Physician(s): [name], FNP  Supervising Physician: Julieanne Cotton  Patient Status: [facility] - Out-pt  History of Present Illness: Jack Lewis is a 40 y.o. male who was seen by Dr. Loni Beckwith back on 08/02/2020 for evaluation of intracranial aneurysms.  MRA done 07/12/20 for workup of headache showed = 1. 1-2 mm laterally projecting vascular protrusion arising from the cavernous left ICA suspicious for small aneurysm. 2. 1-2 mm inferiorly projecting vascular protrusion arising from the paraclinoid left ICA, which may reflect an aneurysm or infundibulum. *Otherwise negative MRI/MRA  He was supposed to be scheduled for diagnostic cerebral angiography, however his symptoms resolved.  He was last seen by [name] on 12/09/20 and her note reads= HAs have resolved. He has canceled IR angio intracranial with Dr [name]. He doesn't want to pursue this test any further.  He is exercising without any headache.  He called yesterday stating symptoms have returned and wishes to proceed with diagnostic angiography.  He is here today for procedure. He is NPO. No nausea/vomiting. No Fever/chills. ROS negative.   Past Medical History:  Diagnosis Date   Allergy    Hypertension    Vertigo     Past Surgical History:  Procedure Laterality Date   APPENDECTOMY     IR RADIOLOGIST EVAL & MGMT  08/02/2020   VASECTOMY     VASECTOMY REVERSAL      Allergies: Patient has no known allergies.  Medications: Prior to Admission medications   Medication Sig Start Date End Date Taking? Authorizing Provider  aspirin EC 81 MG tablet Take 324 mg by mouth 2 (two) times daily as needed for moderate pain. Swallow whole.   Yes [provider]  lisinopril (ZESTRIL) 40 MG tablet Take 1 tablet (40 mg total) by mouth daily. 02/18/21  Yes Arnett, Lyn Records, FNP  loratadine (CLARITIN) 10 MG tablet Take 10 mg by mouth daily.   Yes  [provider]  montelukast (SINGULAIR) 10 MG tablet Take 1 tablet (10 mg total) by mouth at bedtime. 12/09/20  Yes [name], FNP  azelastine (ASTELIN) 0.1 % nasal spray Place 1 spray into both nostrils 2 (two) times daily. Use in each nostril as directed Patient not taking: No sig reported 09/20/20   [name], FNP     Family History  Problem Relation Age of Onset   Hypertension Mother    Diabetes Father    Hypertension Father    Heart attack Father 86   Heart attack Paternal Grandfather 68    Social History   Socioeconomic History   Marital status: Married    Spouse name: Not on file   Number of children: 3   Years of education: Not on file   Highest education level: Not on file  Occupational History   Not on file  Tobacco Use   Smoking status: Never   Smokeless tobacco: Never  Vaping Use   Vaping Use: Never used  Substance and Sexual Activity   Alcohol use: Not Currently    Alcohol/week: 0.0 standard drinks    Comment: occasional   Drug use: No   Sexual activity: Yes    Birth control/protection: Surgical  Other Topics Concern   Not on file  Social History Narrative   Married with 3 boys.    Works in [employer], more office job   Social Determinants of Corporate investment banker Strain: Not on [employer] Insecurity: Not on  file  Transportation Needs: Not on file  Physical Activity: Not on file  Stress: Not on file  Social Connections: Not on file     Review of Systems: A 12 point ROS discussed and pertinent positives are indicated in the HPI above.  All other systems are negative.  Review of Systems  Vital Signs: BP 123/79 (BP Location: Left Arm)   Pulse 66   Temp 98.2 F (36.8 C) (Oral)   Resp 14   SpO2 97%   Physical Exam Vitals reviewed.  Constitutional:      Appearance: Normal appearance.  HENT:     Head: Normocephalic and atraumatic.  Eyes:     Extraocular Movements: Extraocular movements intact.   Cardiovascular:     Rate and Rhythm: Normal rate and regular rhythm.  Pulmonary:     Effort: Pulmonary effort is normal. No respiratory distress.     Breath sounds: Normal breath sounds.  Abdominal:     General: There is no distension.     Palpations: Abdomen is soft.     Tenderness: There is no abdominal tenderness.  Musculoskeletal:        General: Normal range of motion.     Cervical back: Normal range of motion.  Skin:    General: Skin is warm and dry.  Neurological:     General: No focal deficit present.     Mental Status: He is alert and oriented to person, place, and time.  Psychiatric:        Mood and Affect: Mood normal.        Behavior: Behavior normal.        Thought Content: Thought content normal.        Judgment: Judgment normal.    Imaging: No results found.  Labs:  CBC: Recent Labs    07/07/20 1629 07/31/20 1010 02/27/21 0755  WBC 6.0 6.3 7.0  HGB 12.4* 14.1 16.3  HCT 38.3* 44.2 50.2  PLT 224 303 242    COAGS: No results for input(s): INR, APTT in the last 8760 hours.  BMP: Recent Labs    04/04/20 1117 04/11/20 0920 07/07/20 1629 07/31/20 1010 12/09/20 0956 02/26/21 1021  NA 137   < > 139 136 137 137  K 4.0   < > 3.6 4.9 4.6 4.4  CL 100   < > 105 97 101 102  CO2 24   < > 26 23 29 31   GLUCOSE 99   < > 140* 76 77 101*  BUN 8   < > 9 12 15 12   CALCIUM 9.6   < > 9.7 9.9 9.9 9.9  CREATININE 1.08   < > 0.96 1.16 1.25 1.28  GFRNONAA 87  --  >60  --   --   --   GFRAA 100  --   --   --   --   --    < > = values in this interval not displayed.    LIVER FUNCTION TESTS: Recent Labs    04/11/20 0920 05/02/20 0914 06/13/20 1047 07/07/20 1629 07/31/20 1010 12/09/20 0956  BILITOT 0.4  --   --  0.7 0.4 0.6  AST 18  --   --  41 26 22  ALT 44  --   --  70* 25 18  ALKPHOS 68  --   --  46 55 64  PROT 7.5  --   --  6.9 7.1 7.0  ALBUMIN 4.6   < > 4.5 3.9 4.6 4.4   < > =  values in this interval not displayed.    TUMOR MARKERS: No results  for input(s): AFPTM, CEA, CA199, CHROMGRNA in the last 8760 hours.  Assessment and Plan:  Small intracranial aneurysms  Will proceed with diagnostic cerebral angiography today by Dr. [name].  Risks and benefits of cerebral angiography were discussed with the patient including, but not limited to bleeding, infection, vascular injury or contrast induced renal failure.  This interventional procedure involves the use of X-rays and because of the nature of the planned procedure, it is possible that we will have prolonged use of X-ray fluoroscopy.  Potential radiation risks to you include (but are not limited to) the following: - A slightly elevated risk for cancer  several years later in life. This risk is typically less than 0.5% percent. This risk is low in comparison to the normal incidence of human cancer, which is 33% for women and 50% for men according to the American Cancer Society. - Radiation induced injury can include skin redness, resembling a rash, tissue breakdown / ulcers and hair loss (which can be temporary or permanent).   The likelihood of either of these occurring depends on the difficulty of the procedure and whether you are sensitive to radiation due to previous procedures, disease, or genetic conditions.   IF your procedure requires a prolonged use of radiation, you will be notified and given written instructions for further action.  It is your responsibility to monitor the irradiated area for the 2 weeks following the procedure and to notify your physician if you are concerned that you have suffered a radiation induced injury.    All of the patient's questions were answered, patient is agreeable to proceed.  Consent signed and in chart.  Electronically Signed: Gwynneth Macleod, PA-C   02/27/2021, 8:22 AM      I spent a total of    15 Minutes in face to face in clinical consultation, greater than 50% of which was counseling/coordinating care for cerebral  angiography.

## 2021-02-27 ENCOUNTER — Ambulatory Visit (HOSPITAL_COMMUNITY)
Admission: RE | Admit: 2021-02-27 | Discharge: 2021-02-27 | Disposition: A | Discharge status: Home / Self Care | Payer: Managed Care, Other (non HMO) | Source: Ambulatory Visit | Attending: Interventional Radiology | Admitting: Interventional Radiology

## 2021-02-27 ENCOUNTER — Other Ambulatory Visit (HOSPITAL_COMMUNITY): Payer: Self-pay | Admitting: Interventional Radiology

## 2021-02-27 DIAGNOSIS — H538 Other visual disturbances: Secondary | ICD-10-CM

## 2021-02-27 DIAGNOSIS — I671 Cerebral aneurysm, nonruptured: Secondary | ICD-10-CM | POA: Diagnosis not present

## 2021-02-27 HISTORY — PX: IR US GUIDE VASC ACCESS RIGHT: IMG2390

## 2021-02-27 HISTORY — PX: IR ANGIO INTRA EXTRACRAN SEL COM CAROTID INNOMINATE BILAT MOD SED: IMG5360

## 2021-02-27 HISTORY — PX: IR 3D INDEPENDENT WKST: IMG2385

## 2021-02-27 HISTORY — PX: IR ANGIO VERTEBRAL SEL SUBCLAVIAN INNOMINATE UNI L MOD SED: IMG5364

## 2021-02-27 HISTORY — PX: IR ANGIO VERTEBRAL SEL VERTEBRAL UNI R MOD SED: IMG5368

## 2021-02-27 LAB — BASIC METABOLIC PANEL
Anion gap: 8 (ref 5–15)
BUN: 11 mg/dL (ref 6–20)
CO2: 24 mmol/L (ref 22–32)
Calcium: 9.3 mg/dL (ref 8.9–10.3)
Chloride: 107 mmol/L (ref 98–111)
Creatinine, Ser: 1.3 mg/dL — ABNORMAL HIGH (ref 0.61–1.24)
GFR, Estimated: >60 mL/min (ref >60)
Glucose, Bld: 101 mg/dL — ABNORMAL HIGH (ref 70–99)
Potassium: 4.5 mmol/L (ref 3.5–5.1)
Sodium: 139 mmol/L (ref 135–145)

## 2021-02-27 LAB — CBC
HCT: 50.2 % (ref 39.0–52.0)
Hemoglobin: 16.3 g/dL (ref 13.0–17.0)
MCH: 28.7 pg (ref 26.0–34.0)
MCHC: 32.5 g/dL (ref 30.0–36.0)
MCV: 88.5 fL (ref 80.0–100.0)
Platelets: 242 10*3/uL (ref 150–400)
RBC: 5.67 MIL/uL (ref 4.22–5.81)
RDW: 14.6 % (ref 11.5–15.5)
WBC: 7 10*3/uL (ref 4.0–10.5)
nRBC: 0 % (ref 0.0–0.2)

## 2021-02-27 LAB — PROTIME-INR
INR: 1 (ref 0.8–1.2)
Prothrombin Time: 13.5 seconds (ref 11.4–15.2)

## 2021-02-27 MED ORDER — FENTANYL CITRATE (PF) 100 MCG/2ML IJ SOLN
INTRAMUSCULAR | Status: AC
  Filled 2021-02-27: qty 2

## 2021-02-27 MED ORDER — NITROGLYCERIN 1 MG/10 ML FOR IR/CATH LAB
INTRA_ARTERIAL | Status: AC
  Filled 2021-02-27: qty 10

## 2021-02-27 MED ORDER — VERAPAMIL HCL 2.5 MG/ML IV SOLN
INTRAVENOUS | Status: AC
  Filled 2021-02-27: qty 2

## 2021-02-27 MED ORDER — HEPARIN SODIUM (PORCINE) 1000 UNIT/ML IJ SOLN
INTRAMUSCULAR | Status: AC
  Filled 2021-02-27: qty 1

## 2021-02-27 MED ORDER — LIDOCAINE HCL (PF) 1 % IJ SOLN
INTRAMUSCULAR | Status: AC | PRN
  Administered 2021-02-27: 5 mL

## 2021-02-27 MED ORDER — MIDAZOLAM HCL 2 MG/2ML IJ SOLN
INTRAMUSCULAR | Status: AC
  Filled 2021-02-27: qty 2

## 2021-02-27 MED ORDER — SODIUM CHLORIDE 0.9 % IV SOLN: INTRAVENOUS | Status: AC

## 2021-02-27 MED ORDER — VERAPAMIL HCL 2.5 MG/ML IV SOLN: INTRA_ARTERIAL | Status: AC | PRN

## 2021-02-27 MED ORDER — HEPARIN SODIUM (PORCINE) 1000 UNIT/ML IJ SOLN
INTRAMUSCULAR | Status: AC | PRN
  Administered 2021-02-27: 2000 [IU] via INTRAVENOUS

## 2021-02-27 MED ORDER — NITROGLYCERIN 1 MG/10 ML FOR IR/CATH LAB
INTRA_ARTERIAL | Status: AC | PRN
  Administered 2021-02-27: 200 ug via INTRA_ARTERIAL

## 2021-02-27 MED ORDER — LIDOCAINE HCL 1 % IJ SOLN
INTRAMUSCULAR | Status: AC
  Filled 2021-02-27: qty 20

## 2021-02-27 MED ORDER — SODIUM CHLORIDE 0.9 % IV SOLN: INTRAVENOUS | Status: DC

## 2021-02-27 MED ORDER — IOHEXOL 300 MG/ML  SOLN
150.0000 mL | Freq: Once | INTRAMUSCULAR | Status: AC | PRN
  Administered 2021-02-27: 105 mL via INTRA_ARTERIAL

## 2021-02-27 MED ORDER — MIDAZOLAM HCL 2 MG/2ML IJ SOLN
INTRAMUSCULAR | Status: AC | PRN
  Administered 2021-02-27: 1 mg via INTRAVENOUS

## 2021-02-27 MED ORDER — FENTANYL CITRATE (PF) 100 MCG/2ML IJ SOLN
INTRAMUSCULAR | Status: AC | PRN
  Administered 2021-02-27: 25 ug via INTRAVENOUS

## 2021-02-27 NOTE — Procedures (Signed)
S/P 4 vessel cerebral arteriogram Rt rad approach . Findings. 1 Approx 2.5 mm x 2.2 mm Lt PCOM  aneurysm. S.Aseem Sessums MD

## 2021-02-27 NOTE — Discharge Instructions (Signed)
Radial Site Care  This sheet gives you information about how to care for yourself after your procedure. Your health care provider may also give you more specific instructions. If you have problems or questions, contact your health care provider. What can I expect after the procedure? After the procedure, it is common to have: Bruising and tenderness at the catheter insertion area. Follow these instructions at home: Medicines Take over-the-counter and prescription medicines only as told by your health care provider. Insertion site care Follow instructions from your health care provider about how to take care of your insertion site. Make sure you: Wash your hands with soap and water before you remove your bandage (dressing). If soap and water are not available, use hand sanitizer. May remove dressing in 24 hours. Check your insertion site every day for signs of infection. Check for: Redness, swelling, or pain. Fluid or blood. Pus or a bad smell. Warmth. Do no take baths, swim, or use a hot tub for 5 days. You may shower 24-48 hours after the procedure. Remove the dressing and gently wash the site with plain soap and water. Pat the area dry with a clean towel. Do not rub the site. That could cause bleeding. Do not apply powder or lotion to the site. Activity  For 24 hours after the procedure, or as directed by your health care provider: Do not flex or bend the affected arm. Do not push or pull heavy objects with the affected arm. Do not drive yourself home from the hospital or clinic. You may drive 24 hours after the procedure. Do not operate machinery or power tools. KEEP ARM ELEVATED THE REMAINDER OF THE DAY. Do not push, pull or lift anything that is heavier than 10 lb for 5 days. Ask your health care provider when it is okay to: Return to work or school. Resume usual physical activities or sports. Resume sexual activity. General instructions If the catheter site starts to  bleed, raise your arm and put firm pressure on the site. If the bleeding does not stop, get help right away. This is a medical emergency. DRINK PLENTY OF FLUIDS FOR THE NEXT 2-3 DAYS. No alcohol consumption for 24 hours after receiving sedation. If you went home on the same day as your procedure, a responsible adult should be with you for the first 24 hours after you arrive home. Keep all follow-up visits as told by your health care provider. This is important. Contact a health care provider if: You have a fever. You have redness, swelling, or yellow drainage around your insertion site. Get help right away if: You have unusual pain at the radial site. The catheter insertion area swells very fast. The insertion area is bleeding, and the bleeding does not stop when you hold steady pressure on the area. Your arm or hand becomes pale, cool, tingly, or numb. These symptoms may represent a serious problem that is an emergency. Do not wait to see if the symptoms will go away. Get medical help right away. Call your local emergency services (911 in the [place] Do not drive yourself to the hospital. Summary After the procedure, it is common to have bruising and tenderness at the site. Follow instructions from your health care provider about how to take care of your radial site wound. Check the wound every day for signs of infection.  This information is not intended to replace advice given to you by your health care provider. Make sure you discuss any questions you have with   your health care provider. Document Revised: 05/12/2017 Document Reviewed: 05/12/2017 Elsevier Patient Education  2020 Elsevier Inc.  

## 2021-02-27 NOTE — Progress Notes (Signed)
Discharge instructions reviewed with pt and his wife. Both voice understanding. 

## 2021-02-27 NOTE — Sedation Documentation (Signed)
Patient transported to PACU. Darreld Mclean RN at the bedside to receive patient. Right wrist assessed, TR band in place. No drainage noted. Soft to touch upon palpation, no hematoma. Palpable +1 radial pulse intact.

## 2021-03-03 ENCOUNTER — Other Ambulatory Visit (HOSPITAL_COMMUNITY): Payer: Self-pay | Admitting: Interventional Radiology

## 2021-03-03 DIAGNOSIS — I671 Cerebral aneurysm, nonruptured: Secondary | ICD-10-CM

## 2021-03-05 ENCOUNTER — Other Ambulatory Visit: Payer: Self-pay

## 2021-03-05 ENCOUNTER — Ambulatory Visit (HOSPITAL_COMMUNITY)
Admission: RE | Admit: 2021-03-05 | Discharge: 2021-03-05 | Disposition: A | Discharge status: Home / Self Care | Payer: Managed Care, Other (non HMO) | Source: Ambulatory Visit | Attending: Interventional Radiology | Admitting: Interventional Radiology

## 2021-03-05 DIAGNOSIS — I671 Cerebral aneurysm, nonruptured: Secondary | ICD-10-CM

## 2021-03-07 HISTORY — PX: IR RADIOLOGIST EVAL & MGMT: IMG5224

## 2021-03-15 ENCOUNTER — Other Ambulatory Visit: Payer: Self-pay | Admitting: Primary Care

## 2021-03-15 DIAGNOSIS — T7840XD Allergy, unspecified, subsequent encounter: Secondary | ICD-10-CM

## 2021-07-24 ENCOUNTER — Telehealth (HOSPITAL_COMMUNITY): Payer: Self-pay | Admitting: Radiology

## 2021-07-24 NOTE — Telephone Encounter (Signed)
Pt called and wanted to know when his f/u was due. Pt is due for MRA head wo in May. Will call patient to schedule once insurance Josem Kaufmann is obtained. He is in agreement with this plan. JM ?

## 2021-07-29 NOTE — Telephone Encounter (Signed)
Attempted to schedule .  Unable to contact after several attempts closing encounter and deleting recall.  ?

## 2021-10-13 ENCOUNTER — Other Ambulatory Visit (HOSPITAL_COMMUNITY): Payer: Self-pay | Admitting: Interventional Radiology

## 2021-10-13 DIAGNOSIS — I671 Cerebral aneurysm, nonruptured: Secondary | ICD-10-CM

## 2021-11-12 ENCOUNTER — Ambulatory Visit (HOSPITAL_COMMUNITY)
Admission: RE | Admit: 2021-11-12 | Discharge: 2021-11-12 | Disposition: A | Discharge status: Home / Self Care | Payer: Managed Care, Other (non HMO) | Source: Ambulatory Visit | Attending: Interventional Radiology | Admitting: Interventional Radiology

## 2021-11-12 DIAGNOSIS — I671 Cerebral aneurysm, nonruptured: Secondary | ICD-10-CM | POA: Diagnosis present

## 2021-11-19 ENCOUNTER — Telehealth (HOSPITAL_COMMUNITY): Payer: Self-pay

## 2021-11-19 NOTE — Telephone Encounter (Signed)
Called regarding recent imaging, no answer, left vm. AW  

## 2022-01-18 ENCOUNTER — Other Ambulatory Visit: Payer: Self-pay | Admitting: Family

## 2022-03-18 ENCOUNTER — Ambulatory Visit: Payer: Managed Care, Other (non HMO) | Admitting: Dermatology

## 2022-04-28 ENCOUNTER — Other Ambulatory Visit: Payer: Self-pay | Admitting: Family

## 2022-06-10 ENCOUNTER — Ambulatory Visit: Payer: Managed Care, Other (non HMO) | Admitting: Family

## 2022-06-10 ENCOUNTER — Other Ambulatory Visit: Payer: Self-pay

## 2022-06-10 ENCOUNTER — Encounter: Payer: Self-pay | Admitting: Family

## 2022-06-10 VITALS — BP 126/82 | HR 71 | Temp 98.0°F | Ht 67.0 in | Wt 233.0 lb

## 2022-06-10 DIAGNOSIS — E669 Obesity, unspecified: Secondary | ICD-10-CM

## 2022-06-10 DIAGNOSIS — T7840XD Allergy, unspecified, subsequent encounter: Secondary | ICD-10-CM

## 2022-06-10 DIAGNOSIS — J302 Other seasonal allergic rhinitis: Secondary | ICD-10-CM

## 2022-06-10 DIAGNOSIS — I10 Essential (primary) hypertension: Secondary | ICD-10-CM

## 2022-06-10 MED ORDER — MONTELUKAST SODIUM 10 MG PO TABS
10.0000 mg | ORAL_TABLET | Freq: Every evening | ORAL | 2 refills | Status: DC
  Filled 2022-06-10: qty 30, fill #0
  Filled 2022-06-22: qty 30, 30d supply, fill #0
  Filled 2022-09-21: qty 30, 30d supply, fill #1
  Filled 2022-10-27: qty 30, 30d supply, fill #2

## 2022-06-10 MED ORDER — WEGOVY 0.25 MG/0.5ML ~~LOC~~ SOAJ
0.2500 mg | SUBCUTANEOUS | 2 refills | Status: DC
  Filled 2022-06-10: qty 2, fill #0
  Filled 2022-06-22: qty 2, 28d supply, fill #0

## 2022-06-10 MED ORDER — WEGOVY 0.25 MG/0.5ML ~~LOC~~ SOAJ: 0.2500 mg | SUBCUTANEOUS | 2 refills | Status: DC

## 2022-06-10 NOTE — Assessment & Plan Note (Addendum)
Discussed wegovy in particular blackbox warning as it relates to medullary thyroid cancer, multiple endocrine neoplasia.  Counseled on side effects and administration.  Goal weight is under 200 lbs. will follow closely.

## 2022-06-10 NOTE — Patient Instructions (Addendum)
Start singulair .  As discussed, if you have any unusual thoughts in regards to self-harm or harm anyone else, please stop medication right away and call 911.   We have discussed starting non insulin daily injectable medication called Wegovy  which is a glucagon like peptide (GLP 1) agonist and works by delaying gastric emptying and increasing insulin secretion.It is given once per week. Most patients see significant weight loss with this drug class.   You may NOT take either medication if you or your family has history of thyroid, parathyroid, OR adrenal cancer. Please confirm you and your family does NOT have this history as this drug class has black box warning on this medication for that reason.   Please follow  directions on prescription and slowly increase from 0.80m [place] once per week ;stay here for 4 weeks. You may then increase to 0.525msc once per week and stay there for 4 weeks.  We can slowly titrate further at follow up with goal of no more than 1-2 lbs weight loss per week.  [facility] ([facility] Dose [facility] Once Weekly Titration:    If a dose is not tolerated, consider delaying further dose increases for  another 4 weeks.  If you are actively losing weight on a dose, do not increase medication.   Weeks 1 through 4  0.25 mg once weekly  Weeks 5 through 8  0.5 mg once weekly  Weeks 9 though 12  1 mg once weekly  Weeks 13 through 16  1.7 mg once weekly   Semaglutide Injection (Weight Management) What is this medication? SEMAGLUTIDE (SEM a GLOO tide) promotes weight loss. It may also be used to maintain weight loss. It works by decreasing appetite. Changes to diet and exercise are often combined with this medication. This medicine may be used for other purposes; ask your health care provider or pharmacist if you have questions. COMMON BRAND NAME(S): [id number] should I tell my care team before I take this medication? They need to know if you have any of these  conditions: Endocrine tumors (MEN 2) or if someone in your family had these tumors Eye disease, vision problems Gallbladder disease History of depression or mental health disease History of pancreatitis Kidney disease Stomach or intestine problems Suicidal thoughts, plans, or attempt; a previous suicide attempt by you or a family member Thyroid cancer or if someone in your family had thyroid cancer An unusual or allergic reaction to semaglutide, other medications, foods, dyes, or preservatives Pregnant or trying to get pregnant Breast-feeding How should I use this medication? This medication is injected under the skin. You will be taught how to prepare and give it. Take it as directed on the prescription label. It is given once every week (every 7 days). Keep taking it unless your care team tells you to stop. It is important that you put your used needles and pens in a special sharps container. Do not put them in a trash can. If you do not have a sharps container, call your pharmacist or care team to get one. A special MedGuide will be given to you by the pharmacist with each prescription and refill. Be sure to read this information carefully each time. This medication comes with INSTRUCTIONS FOR USE. Ask your pharmacist for directions on how to use this medication. Read the information carefully. Talk to your pharmacist or care team if you have questions. Talk to your care team about the use of this medication in children. While it  may be prescribed for children as young as 12 years for selected conditions, precautions do apply. Overdosage: If you think you have taken too much of this medicine contact a poison control center or emergency room at once. NOTE: This medicine is only for you. Do not share this medicine with others. What if I miss a dose? If you miss a dose and the next scheduled dose is more than 2 days away, take the missed dose as soon as possible. If you miss a dose and the next  scheduled dose is less than 2 days away, do not take the missed dose. Take the next dose at your regular time. Do not take double or extra doses. If you miss your dose for 2 weeks or more, take the next dose at your regular time or call your care team to talk about how to restart this medication. What may interact with this medication? Insulin and other medications for diabetes This list may not describe all possible interactions. Give your health care provider a list of all the medicines, herbs, non-prescription drugs, or dietary supplements you use. Also tell them if you smoke, drink alcohol, or use illegal drugs. Some items may interact with your medicine. What should I watch for while using this medication? Visit your care team for regular checks on your progress. It may be some time before you see the benefit from this medication. Drink plenty of fluids while taking this medication. Check with your care team if you have severe diarrhea, nausea, and vomiting, or if you sweat a lot. The loss of too much body fluid may make it dangerous for you to take this medication. This medication may affect blood sugar levels. Ask your care team if changes in diet or medications are needed if you have diabetes. If you or your family notice any changes in your behavior, such as new or worsening depression, thoughts of harming yourself, anxiety, other unusual or disturbing thoughts, or memory loss, call your care team right away. Women should inform their care team if they wish to become pregnant or think they might be pregnant. Losing weight while pregnant is not advised and may cause harm to the unborn child. Talk to your care team for more information. What side effects may I notice from receiving this medication? Side effects that you should report to your care team as soon as possible: Allergic reactions--skin rash, itching, hives, swelling of the face, lips, tongue, or throat Change in  vision Dehydration--increased thirst, dry mouth, feeling faint or lightheaded, headache, dark yellow or brown urine Gallbladder problems--severe stomach pain, nausea, vomiting, fever Heart palpitations--rapid, pounding, or irregular heartbeat Kidney injury--decrease in the amount of urine, swelling of the ankles, hands, or feet Pancreatitis--severe stomach pain that spreads to your back or gets worse after eating or when touched, fever, nausea, vomiting Thoughts of suicide or self-harm, worsening mood, feelings of depression Thyroid cancer--new mass or lump in the neck, pain or trouble swallowing, trouble breathing, hoarseness Side effects that usually do not require medical attention (report to your care team if they continue or are bothersome): Diarrhea Loss of appetite Nausea Stomach pain Vomiting This list may not describe all possible side effects. Call your doctor for medical advice about side effects. You may report side effects to FDA at 1-800-FDA-1088. Where should I keep my medication? Keep out of the reach of children and pets. Refrigeration (preferred): Store in the refrigerator. Do not freeze. Keep this medication in the original container until you are  ready to take it. Get rid of any unused medication after the expiration date. Room temperature: If needed, prior to cap removal, the pen can be stored at room temperature for up to 28 days. Protect from light. If it is stored at room temperature, get rid of any unused medication after 28 days or after it expires, whichever is first. It is important to get rid of the medication as soon as you no longer need it or it is expired. You can do this in two ways: Take the medication to a medication take-back program. Check with your pharmacy or [place] enforcement to find a location. If you cannot return the medication, follow the directions in the [facility]. NOTE: This sheet is a summary. It may not cover all possible information. If you have  questions about this medicine, talk to your doctor, pharmacist, or health care provider.  2023 Elsevier/Gold Standard (2020-06-20 00:00:00)

## 2022-06-10 NOTE — Progress Notes (Signed)
Assessment & Plan:  Obesity (BMI 35.0-39.9 without comorbidity) Assessment & Plan: Discussed wegovy in particular blackbox warning as it relates to medullary thyroid cancer, multiple endocrine neoplasia.  Counseled on side effects and administration.  Goal weight is under 200 lbs. will follow closely.  Orders: -     Comprehensive metabolic panel; Future -     Hemoglobin A1c; Future -     TSH; Future -     Lipid panel; Future -     Wegovy; Inject 0.25 mg into the skin once a week.  Dispense: 2 mL; Refill: 2  Primary hypertension -     Comprehensive metabolic panel; Future  Allergy, subsequent encounter -     Montelukast Sodium; Take 1 tablet (10 mg total) by mouth at bedtime.  Dispense: 30 tablet; Refill: 2  Seasonal allergies Assessment & Plan: Patient currently taking Allegra and Claritin with suboptimal control.  Will start Singulair nightly.  Counseled on suicidality.  Consider ENT allergy testing. Close follow-up.      Return precautions given.   Risks, benefits, and alternatives of the medications and treatment plan prescribed today were discussed, and patient expressed understanding.   Education regarding symptom management and diagnosis given to patient on AVS either electronically or printed.  Return in about 2 months (around 08/09/2022).  [name], FNP  Subjective:    Patient ID: Jack Lewis, male    DOB: February 02, 1981, 42 y.o.   MRN: [id number]  CC: Jack Lewis is a 42 y.o. male who presents today for follow up.   HPI: Here today to discuss weight loss medication. He has gained 30 lbs.     No history of diabetes. HA has resolved.   He stopped consistently working out for a period of time 2 years ago after diagnosis of brain aneurysm. 11 months ago he started working out again 4-5 days per week however has not been able to loose weight.    Endorses increased food cravings and he feels unable to follow lower carb diet; he is craving sugar and  bread.   No personal or family h/o MEN, thyroid cancer.    Chronic sinus congestion for years.  He has seen ENT in the past whom recommended allergy treatment.   No fever, sinus swelling.    Compliant with allegra and claritin.           Denies thoughts of hurting himself or anyone else.  No previous suicide attempts.  Allergies: Patient has no known allergies. Current Outpatient Medications on File Prior to Visit  Medication Sig Dispense Refill   lisinopril (ZESTRIL) 40 MG tablet TAKE 1 TABLET(40 MG) BY MOUTH DAILY 90 tablet 0   loratadine (CLARITIN) 10 MG tablet Take 10 mg by mouth daily.     No current facility-administered medications on file prior to visit.    Review of Systems  Constitutional:  Negative for chills and fever.  HENT:  Positive for congestion. Negative for sinus pressure.   Respiratory:  Negative for cough.   Cardiovascular:  Negative for chest pain and palpitations.  Gastrointestinal:  Negative for nausea and vomiting.  Neurological:  Negative for headaches.      Objective:    BP 126/82   Pulse 71   Temp 98 F (36.7 C) (Oral)   Ht 5' 7"$  (1.702 m)   Wt 233 lb (105.7 kg)   SpO2 99%   BMI 36.49 kg/m  BP Readings from Last 3 Encounters:  06/10/22 126/82  02/27/21 124/65  12/09/20 118/76   Wt Readings from Last 3 Encounters:  06/10/22 233 lb (105.7 kg)  12/09/20 205 lb 12.8 oz (93.4 kg)  09/20/20 205 lb 6.4 oz (93.2 kg)    Physical Exam Vitals reviewed.  Constitutional:      Appearance: He is well-developed.  HENT:     Nose:     Right Sinus: No maxillary sinus tenderness or frontal sinus tenderness.     Left Sinus: No maxillary sinus tenderness or frontal sinus tenderness.  Cardiovascular:     Rate and Rhythm: Regular rhythm.     Heart sounds: Normal heart sounds.  Pulmonary:     Effort: Pulmonary effort is normal. No respiratory distress.     Breath sounds: Normal breath sounds. No wheezing, rhonchi or rales.  Skin:     General: Skin is warm and dry.  Neurological:     Mental Status: He is alert.  Psychiatric:        Speech: Speech normal.        Behavior: Behavior normal.

## 2022-06-10 NOTE — Assessment & Plan Note (Signed)
Patient currently taking Allegra and Claritin with suboptimal control.  Will start Singulair nightly.  Counseled on suicidality.  Consider ENT allergy testing. Close follow-up.

## 2022-06-22 ENCOUNTER — Other Ambulatory Visit: Payer: Self-pay

## 2022-08-09 ENCOUNTER — Other Ambulatory Visit: Payer: Self-pay | Admitting: Family

## 2022-08-10 ENCOUNTER — Ambulatory Visit: Payer: Managed Care, Other (non HMO) | Admitting: Family

## 2022-08-14 ENCOUNTER — Telehealth: Payer: Self-pay

## 2022-08-14 NOTE — Telephone Encounter (Signed)
PA initiated via Covermymeds; KEY: [id number]. Awaiting determination.

## 2022-08-17 NOTE — Telephone Encounter (Signed)
PA approved.    Request Reference Number: [id number]. WEGOVY INJ 0.25MG  is approved through 03/16/2023. Your patient may now fill this prescription and it will be covered.. Authorization Expiration Date: March 16, 2023.

## 2022-08-21 ENCOUNTER — Other Ambulatory Visit: Payer: Self-pay

## 2022-08-26 ENCOUNTER — Encounter: Payer: Self-pay | Admitting: Family

## 2022-08-27 ENCOUNTER — Other Ambulatory Visit: Payer: Self-pay | Admitting: Family

## 2022-08-27 ENCOUNTER — Other Ambulatory Visit: Payer: Self-pay

## 2022-08-27 ENCOUNTER — Encounter: Payer: Self-pay | Admitting: Pharmacist

## 2022-08-27 DIAGNOSIS — Z6834 Body mass index (BMI) 34.0-34.9, adult: Secondary | ICD-10-CM

## 2022-08-27 MED ORDER — SAXENDA 18 MG/3ML ~~LOC~~ SOPN: 0.6000 mg | PEN_INJECTOR | Freq: Every day | SUBCUTANEOUS | 3 refills | Status: DC

## 2022-08-27 MED ORDER — SAXENDA 18 MG/3ML ~~LOC~~ SOPN
0.6000 mg | PEN_INJECTOR | Freq: Every day | SUBCUTANEOUS | 3 refills | Status: DC
  Filled 2022-08-27: qty 3, 30d supply, fill #0
  Filled 2022-09-07 – 2022-09-21 (×2): qty 15, 30d supply, fill #0
  Filled 2022-09-23: qty 3, 30d supply, fill #0
  Filled 2022-09-25: qty 15, 30d supply, fill #0
  Filled 2022-10-27: qty 15, 30d supply, fill #1
  Filled 2022-12-29: qty 15, 30d supply, fill #2
  Filled 2023-02-10: qty 15, 30d supply, fill #3

## 2022-09-07 ENCOUNTER — Other Ambulatory Visit: Payer: Self-pay

## 2022-09-21 ENCOUNTER — Other Ambulatory Visit: Payer: Self-pay

## 2022-09-23 ENCOUNTER — Other Ambulatory Visit: Payer: Self-pay

## 2022-09-24 ENCOUNTER — Telehealth: Payer: Self-pay | Admitting: Pharmacy Technician

## 2022-09-24 NOTE — Telephone Encounter (Signed)
Patient Advocate Encounter   Received notification that prior authorization for Saxenda 18MG /3ML pen-injectors is required.   PA submitted on 09/24/2022 Key [id number] Insurance [employer] Electronic Prior Authorization Form Status is pending

## 2022-09-25 ENCOUNTER — Other Ambulatory Visit: Payer: Self-pay

## 2022-09-25 NOTE — Telephone Encounter (Signed)
Patient Advocate Encounter  Prior Authorization for Saxenda 18MG /3ML pen-injectors has been approved.    PA# [id number] Insurance [employer] Electronic Prior Authorization Form Effective dates: 09/24/2022 through 03/26/2023

## 2022-09-28 IMAGING — CR DG CHEST 2V
1 series · 2 of 2 positions shown · non-contrast
Comparison: July 12, 2012.

CLINICAL DATA: Chest pain.

EXAM:
CHEST - 2 VIEW

[Series 1: dg chest 2 view · 0.14mm/px · 2 of 2 slices shown]
[im 1/2]
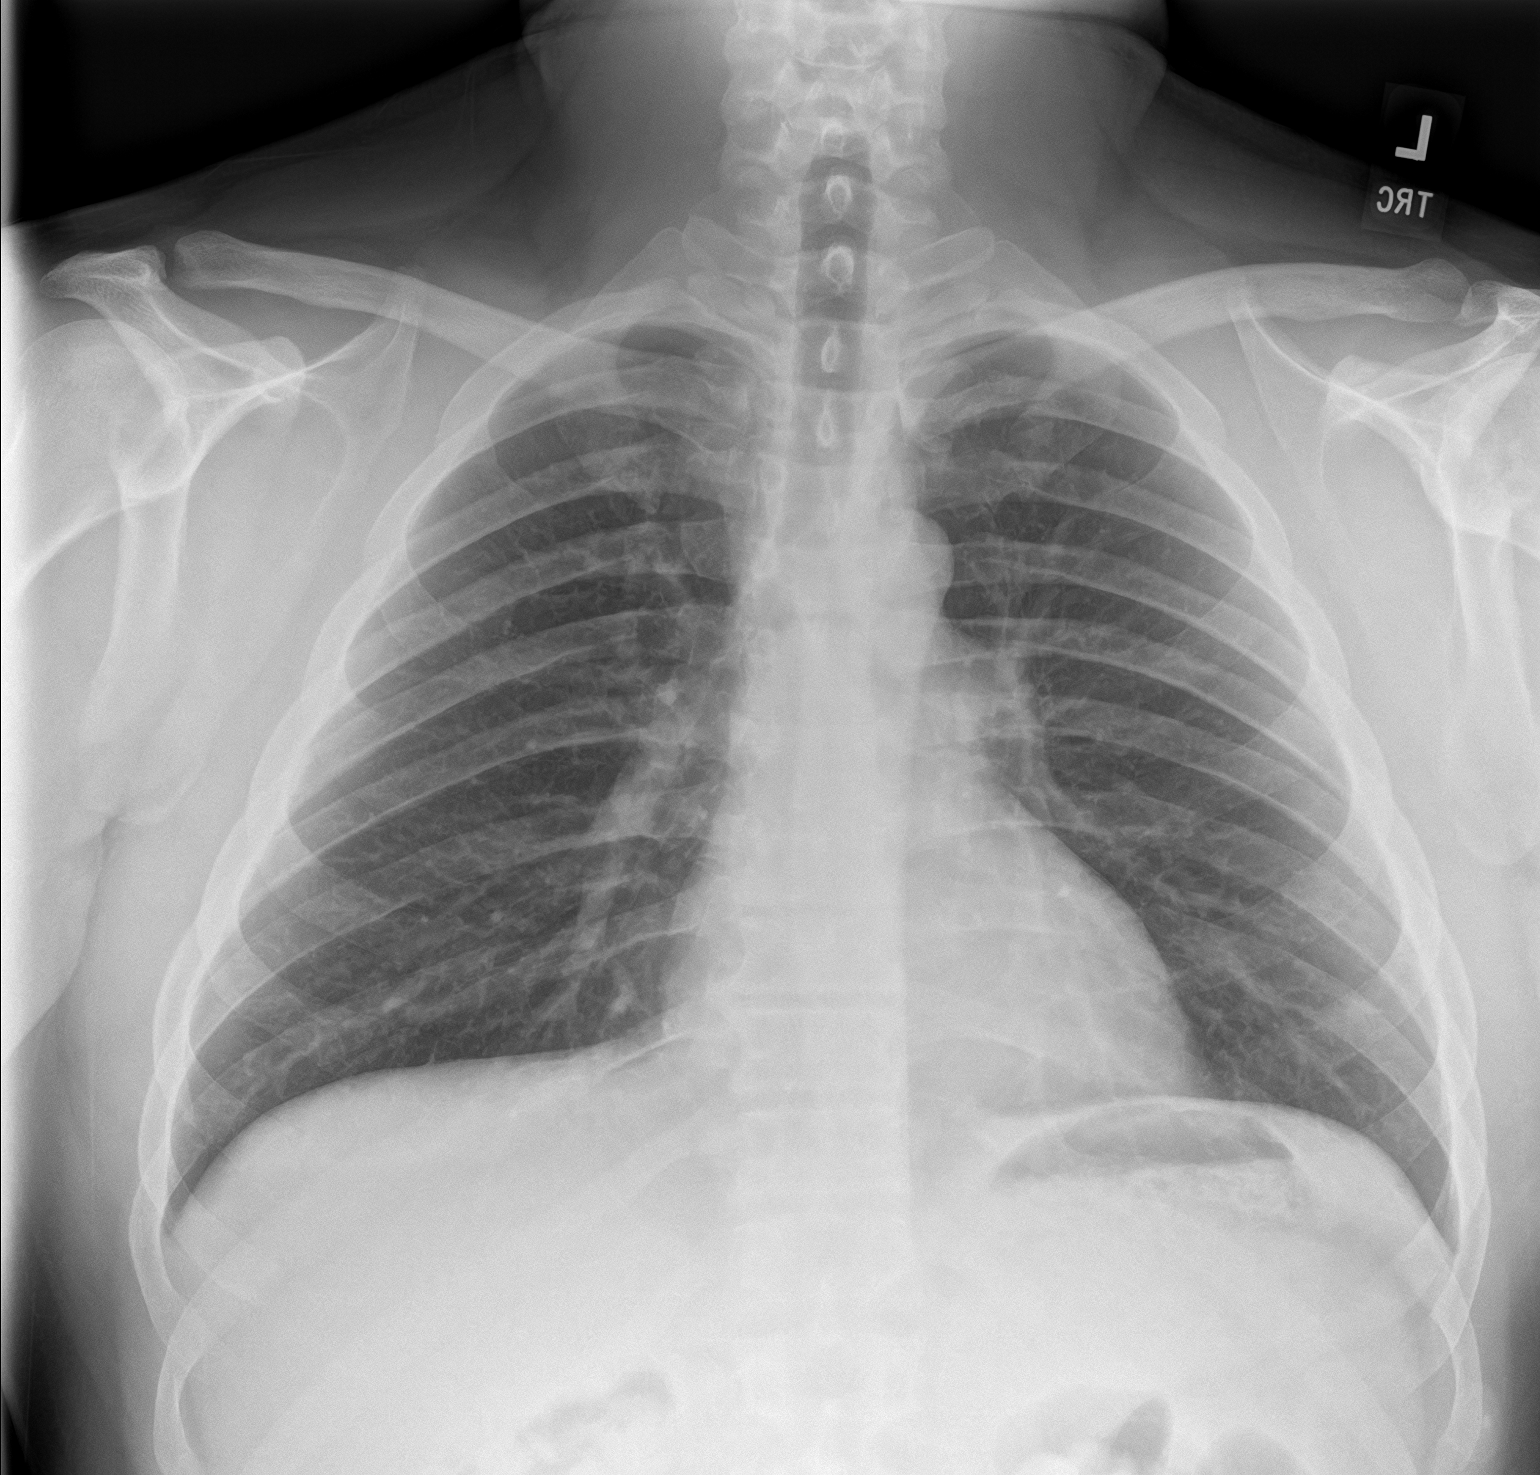
[im 2/2]
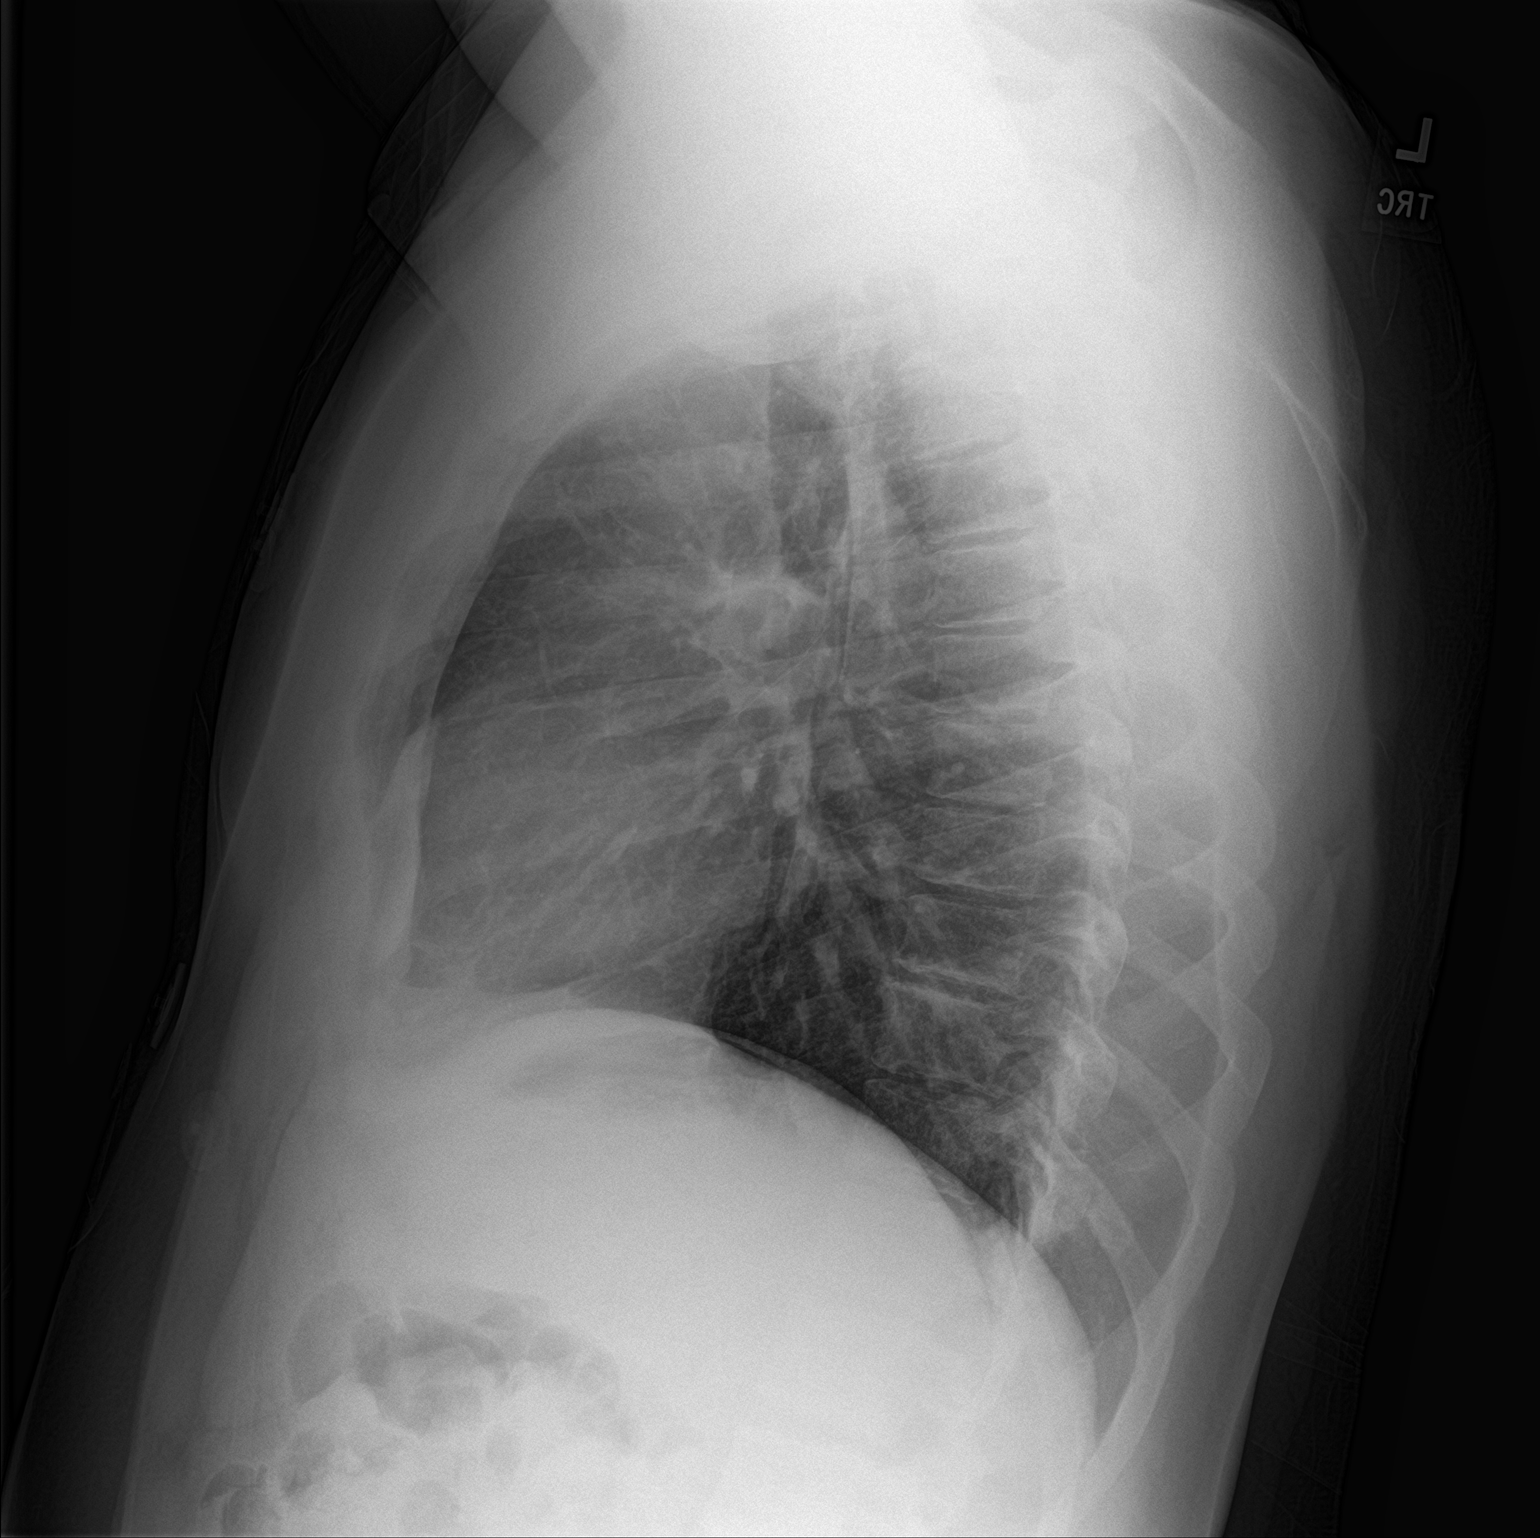

[2 of 2 positions shown; findings below may reference images not displayed]

FINDINGS: The heart size and mediastinal contours are within normal limits.
Both lungs are clear. No pneumothorax or pleural effusion is noted.
The visualized skeletal structures are unremarkable.
IMPRESSION: No active cardiopulmonary disease.

## 2022-10-27 ENCOUNTER — Other Ambulatory Visit: Payer: Self-pay

## 2022-11-02 ENCOUNTER — Other Ambulatory Visit: Payer: Self-pay

## 2022-11-16 ENCOUNTER — Other Ambulatory Visit: Payer: Self-pay | Admitting: Family

## 2022-11-18 ENCOUNTER — Other Ambulatory Visit: Payer: Self-pay | Admitting: Family

## 2022-11-24 NOTE — Telephone Encounter (Signed)
LVM to call back to inform pt that we need  to confirm dose and we can refill as appropriate   Sch BP f/u appt , last seen 05/2022

## 2022-11-30 NOTE — Telephone Encounter (Signed)
LVM to call back to schedule f/up for bp and confirm dosage he is taking

## 2022-12-07 MED ORDER — LISINOPRIL 40 MG PO TABS: 40.0000 mg | ORAL_TABLET | Freq: Every day | ORAL | 0 refills | Status: DC

## 2022-12-09 ENCOUNTER — Other Ambulatory Visit: Payer: Self-pay

## 2022-12-09 ENCOUNTER — Other Ambulatory Visit: Payer: Self-pay | Admitting: Family

## 2022-12-09 DIAGNOSIS — T7840XD Allergy, unspecified, subsequent encounter: Secondary | ICD-10-CM

## 2022-12-09 MED ORDER — MONTELUKAST SODIUM 10 MG PO TABS
10.0000 mg | ORAL_TABLET | Freq: Every evening | ORAL | 2 refills | Status: DC
  Filled 2022-12-09 – 2022-12-29 (×2): qty 30, 30d supply, fill #0
  Filled 2023-02-10: qty 30, 30d supply, fill #1
  Filled 2023-03-24: qty 30, 30d supply, fill #2

## 2022-12-29 ENCOUNTER — Other Ambulatory Visit: Payer: Self-pay

## 2023-01-22 ENCOUNTER — Emergency Department: Payer: Managed Care, Other (non HMO)

## 2023-01-22 ENCOUNTER — Other Ambulatory Visit: Payer: Self-pay

## 2023-01-22 ENCOUNTER — Emergency Department
Admission: EM | Admit: 2023-01-22 | Discharge: 2023-01-22 | Disposition: A | Discharge status: Home / Self Care | Payer: Managed Care, Other (non HMO) | Attending: Emergency Medicine | Admitting: Emergency Medicine

## 2023-01-22 DIAGNOSIS — I10 Essential (primary) hypertension: Secondary | ICD-10-CM | POA: Diagnosis not present

## 2023-01-22 DIAGNOSIS — W19XXXA Unspecified fall, initial encounter: Secondary | ICD-10-CM | POA: Diagnosis not present

## 2023-01-22 DIAGNOSIS — M542 Cervicalgia: Secondary | ICD-10-CM | POA: Diagnosis present

## 2023-01-22 MED ORDER — HYDROCODONE-ACETAMINOPHEN 5-325 MG PO TABS: 1.0000 | ORAL_TABLET | ORAL | 0 refills | Status: AC | PRN

## 2023-01-22 MED ORDER — ACETAMINOPHEN 325 MG PO TABS
ORAL_TABLET | ORAL | Status: AC
  Filled 2023-01-22: qty 2

## 2023-01-22 MED ORDER — CYCLOBENZAPRINE HCL 10 MG PO TABS: 10.0000 mg | ORAL_TABLET | Freq: Three times a day (TID) | ORAL | 0 refills | Status: AC | PRN

## 2023-01-22 MED ORDER — ACETAMINOPHEN 325 MG PO TABS
650.0000 mg | ORAL_TABLET | Freq: Once | ORAL | Status: AC
  Administered 2023-01-22: 650 mg via ORAL

## 2023-01-22 NOTE — ED Provider Triage Note (Signed)
Emergency Medicine Provider Triage Evaluation Note  Jack Lewis , a 42 y.o. male  was evaluated in triage.  Pt complains of a fall. States he thinks he hit his head and landed on his shoulder. No LOC. States he feels a "crick" in his neck. Feels like it has been getting worse all day. Pain increases with movement.   Review of Systems  Positive: Neck pain Negative: No numbness or tingling down arms  Physical Exam  There were no vitals taken for this visit. Gen:   Awake, no distress   Resp:  Normal effort  MSK:   Moves extremities without difficulty Other:    Medical Decision Making  Medically screening exam initiated at 5:22 PM.  Appropriate orders placed.  Jack Lewis was informed that the remainder of the evaluation will be completed by another provider, this initial triage assessment does not replace that evaluation, and the importance of remaining in the ED until their evaluation is complete.    Cameron Ali, PA-C 01/22/23 1724

## 2023-01-22 NOTE — ED Triage Notes (Addendum)
Pt states he fell in tube at playground. Pt denies loc, reports upper back pain/neck pain that has gotten worse with time today. Pt is AOX4, NAD noted. CMS intact, pt denies numbness/tingling. Pt denies blood thinners.

## 2023-01-22 NOTE — ED Provider Notes (Signed)
[facility] Provider Note    Event Date/Time   First MD Initiated Contact with Patient 01/22/23 1853     (approximate)   History   Fall   HPI  TAEGEN DELKER is a 42 y.o. male  with PMH of HTN, vertigo, insomnia who presents for evaluation after a fall earlier today. Patient does not think he hit his head, no LOC. He states when he fell he landed on his shoulder and is now having neck and shoulder pain. He denies radiation into his arms. No numbness or weakness in the arms.       Physical Exam   Triage Vital Signs: ED Triage Vitals  Encounter Vitals Group     BP 01/22/23 1723 134/71     Systolic BP Percentile --      Diastolic BP Percentile --      Pulse Rate 01/22/23 1723 69     Resp 01/22/23 1723 18     Temp 01/22/23 1724 98.2 F (36.8 C)     Temp Source 01/22/23 1724 Oral     SpO2 01/22/23 1723 98 %     Weight --      Height --      Head Circumference --      Peak Flow --      Pain Score 01/22/23 1724 8     Pain Loc --      Pain Education --      Exclude from Growth Chart --     Most recent vital signs: Vitals:   01/22/23 1723 01/22/23 1724  BP: 134/71   Pulse: 69   Resp: 18   Temp:  98.2 F (36.8 C)  SpO2: 98%     General: Awake, no distress.  CV:  Good peripheral perfusion.  Resp:  Normal effort.  Abd:  No distention.  Other:  Mild TTP over the cervical vertebral spines and paraspinal muscles, patient unable to extend his neck due to pain   ED Results / Procedures / Treatments   Labs (all labs ordered are listed, but only abnormal results are displayed) Labs Reviewed - No data to display  RADIOLOGY  CT of the cervical spine obtained, I interpreted the images as well as reviewed the radiologist report. Images are negative for any acute fractures and traumatic alignment.  PROCEDURES:  Critical Care performed: No  Procedures   MEDICATIONS ORDERED IN ED: Medications  acetaminophen (TYLENOL) tablet 650 mg (650  mg Oral Given 01/22/23 1730)     IMPRESSION / MDM / ASSESSMENT AND PLAN / ED COURSE  I reviewed the triage vital signs and the nursing notes.                             42 year old male presents for evaluation of neck and shoulder pain after a fall earlier today. VSS and patient NAD on exam.  Differential diagnosis includes, but is not limited to, cervical vertebrae fracture, brachial plexus injury, muscle strain, cervical radiculopathy.  Patient's presentation is most consistent with acute complicated illness / injury requiring diagnostic workup.  CT of the cervical spine obtained, I interpreted the images as well as reviewed the radiologist report which was negative. Given the negative imaging I feel patient is stable for outpatient management. He will be treated with anti-inflammatories, pain medication and muscle relaxer. Patient was agreeable to plan, voiced understanding and was stable at discharge.     FINAL  CLINICAL IMPRESSION(S) / ED DIAGNOSES   Final diagnoses:  Fall, initial encounter     Rx / DC Orders   ED Discharge Orders          Ordered    cyclobenzaprine (FLEXERIL) 10 MG tablet  3 times daily PRN        01/22/23 1918    HYDROcodone-acetaminophen (NORCO/VICODIN) 5-325 MG tablet  Every 4 hours PRN        01/22/23 1918             Note:  This document was prepared using Dragon voice recognition software and may include unintentional dictation errors.   Cameron Ali, PA-C 01/22/23 Angelique Blonder, MD 01/22/23 2026

## 2023-01-22 NOTE — Discharge Instructions (Signed)
You can take 650 mg of Tylenol and 600 mg of ibuprofen every 6 hours as needed for mild-moderate pain.  You can take the norco every 4 hours as needed for severe pain. You can take the flexeril 3 times daily as needed for muscle spasms. Keep in mind that these medications can be sedating so you should not drive or operate machinery while taking them.   Please apply a heating pad to your neck and do some gentle range of motion exercises daily to prevent stiffness.

## 2023-02-11 ENCOUNTER — Other Ambulatory Visit: Payer: Self-pay

## 2023-02-15 ENCOUNTER — Other Ambulatory Visit: Payer: Self-pay | Admitting: Family

## 2023-02-15 DIAGNOSIS — E6609 Other obesity due to excess calories: Secondary | ICD-10-CM

## 2023-03-11 MED ORDER — SAXENDA 18 MG/3ML ~~LOC~~ SOPN
0.6000 mg | PEN_INJECTOR | Freq: Every day | SUBCUTANEOUS | 0 refills | Status: DC
Start: 2023-03-11 — End: 2023-03-22

## 2023-03-11 NOTE — Telephone Encounter (Signed)
Call patient I provided courtesy refill of Saxenda today and lisinopril on 02/22/2023.  Please schedule follow-up with me in the next couple of weeks

## 2023-03-11 NOTE — Addendum Note (Signed)
Addended by: [name] on: 03/11/2023 05:04 PM   Modules accepted: Orders

## 2023-03-12 ENCOUNTER — Encounter: Payer: Self-pay | Admitting: *Deleted

## 2023-03-22 ENCOUNTER — Other Ambulatory Visit: Payer: Self-pay | Admitting: Family

## 2023-03-22 DIAGNOSIS — E66811 Obesity, class 1: Secondary | ICD-10-CM

## 2023-03-24 ENCOUNTER — Other Ambulatory Visit: Payer: Self-pay

## 2023-03-24 MED ORDER — SAXENDA 18 MG/3ML ~~LOC~~ SOPN
0.6000 mg | PEN_INJECTOR | Freq: Every day | SUBCUTANEOUS | 0 refills | Status: DC
Start: 2023-03-24 — End: 2023-04-19

## 2023-03-30 ENCOUNTER — Telehealth: Payer: Self-pay | Admitting: Pharmacist

## 2023-03-30 ENCOUNTER — Telehealth: Payer: Self-pay | Admitting: Family Medicine

## 2023-03-30 NOTE — Telephone Encounter (Unsigned)
Copied from CRM [id number]. Topic: Clinical - Prescription Issue >> Mar 30, 2023 10:55 AM Gaetano Hawthorne wrote: Reason for CRM: [facility] [facility] is calling regarding some questions about a prior authorized that they received on the patient's Saxenda - Please provide the following Case Number # [id number] and they will review the questions.  Call back number is [phone]

## 2023-03-30 NOTE — Telephone Encounter (Signed)
Pharmacy Patient Advocate Encounter  Received notification from [facility] that Prior Authorization for Saxenda has been DENIED.  See denial reason below. No denial letter attached in CMM. Will attach denial letter to Media tab once received.   PA #/Case ID/Reference #: [id number]   Need updated chart notes to be able to provide the following information for PA Renewal:

## 2023-03-30 NOTE — Telephone Encounter (Signed)
Pharmacy Patient Advocate Encounter   Received notification from CoverMyMeds that prior authorization for Saxenda 18MG /3ML pen-injectors is required/requested.   Insurance verification completed.   The patient is insured through [facility] .   Per test claim: PA required; PA submitted to above mentioned insurance via CoverMyMeds Key/confirmation #/EOC [id number] Status is pending

## 2023-03-30 NOTE — Telephone Encounter (Signed)
LVM to make pt aware and sent my chart message as well

## 2023-04-01 NOTE — Telephone Encounter (Signed)
Spoke to pt and he was aware of denial, but PA was resubmitted per [facility] [facility]. I will reach back out to pt when I hear back

## 2023-04-02 ENCOUNTER — Telehealth: Payer: Self-pay

## 2023-04-02 ENCOUNTER — Other Ambulatory Visit (HOSPITAL_COMMUNITY): Payer: Self-pay

## 2023-04-02 NOTE — Telephone Encounter (Signed)
PA request has been Submitted. New Encounter created for follow up. For additional info see Pharmacy Prior Auth telephone encounter from 04/02/23.

## 2023-04-02 NOTE — Telephone Encounter (Signed)
Pharmacy Patient Advocate Encounter   Received notification from Pt Calls Messages that prior authorization for Saxenda 18MG /3ML pen-injectors is required/requested.   Insurance verification completed.   The patient is insured through [facility] .   Per test claim: PA required; PA submitted to above mentioned insurance via CoverMyMeds Key/confirmation #/EOC [facility] Status is pending

## 2023-04-02 NOTE — Telephone Encounter (Signed)
Pt has been notified.

## 2023-04-05 NOTE — Telephone Encounter (Signed)
Pharmacy Patient Advocate Encounter  Received notification from [facility] that Prior Authorization for Saxenda 18MG /3ML pen-injectors has been CANCELLED due to: SAXENDA INJ 18MG /3ML has been denied on 03/30/2023   PA #/Case ID/Reference #: [id number]

## 2023-04-05 NOTE — Telephone Encounter (Signed)
LVM to call back to I form pt that    Pharmacy Patient Advocate Encounter   Received notification from [facility] that Prior Authorization for Saxenda 18MG /3ML pen-injectors has been CANCELLED due to: SAXENDA INJ 18MG /3ML has been denied on 03/30/2023    PA #/Case ID/Reference #: [id number]

## 2023-04-07 NOTE — Telephone Encounter (Signed)
Spoke to pt and he would like to know if you have other  suggestions that he could possibly try out

## 2023-04-09 NOTE — Telephone Encounter (Signed)
Spoke to pt and scheduled in office visit to discuss weight loss medication

## 2023-04-19 ENCOUNTER — Ambulatory Visit: Payer: Managed Care, Other (non HMO) | Admitting: Family

## 2023-04-19 ENCOUNTER — Encounter: Payer: Self-pay | Admitting: Family

## 2023-04-19 VITALS — BP 120/85 | HR 63 | Temp 97.4°F | Ht 67.0 in | Wt 230.2 lb

## 2023-04-19 DIAGNOSIS — I671 Cerebral aneurysm, nonruptured: Secondary | ICD-10-CM | POA: Insufficient documentation

## 2023-04-19 DIAGNOSIS — Z6834 Body mass index (BMI) 34.0-34.9, adult: Secondary | ICD-10-CM | POA: Diagnosis not present

## 2023-04-19 DIAGNOSIS — E66811 Obesity, class 1: Secondary | ICD-10-CM | POA: Diagnosis not present

## 2023-04-19 DIAGNOSIS — E6609 Other obesity due to excess calories: Secondary | ICD-10-CM

## 2023-04-19 DIAGNOSIS — I10 Essential (primary) hypertension: Secondary | ICD-10-CM

## 2023-04-19 DIAGNOSIS — T7840XD Allergy, unspecified, subsequent encounter: Secondary | ICD-10-CM | POA: Diagnosis not present

## 2023-04-19 DIAGNOSIS — J302 Other seasonal allergic rhinitis: Secondary | ICD-10-CM

## 2023-04-19 LAB — CBC WITH DIFFERENTIAL/PLATELET
Basophils Absolute: 0 10*3/uL (ref 0.0–0.1)
Basophils Relative: 0.7 % (ref 0.0–3.0)
Eosinophils Absolute: 0.1 10*3/uL (ref 0.0–0.7)
Eosinophils Relative: 1.5 % (ref 0.0–5.0)
HCT: 42.6 % (ref 39.0–52.0)
Hemoglobin: 14.1 g/dL (ref 13.0–17.0)
Lymphocytes Relative: 33.7 % (ref 12.0–46.0)
Lymphs Abs: 2.3 10*3/uL (ref 0.7–4.0)
MCHC: 33.2 g/dL (ref 30.0–36.0)
MCV: 92.1 fL (ref 78.0–100.0)
Monocytes Absolute: 0.4 10*3/uL (ref 0.1–1.0)
Monocytes Relative: 5.3 % (ref 3.0–12.0)
Neutro Abs: 4 10*3/uL (ref 1.4–7.7)
Neutrophils Relative %: 58.8 % (ref 43.0–77.0)
Platelets: 235 10*3/uL (ref 150.0–400.0)
RBC: 4.62 Mil/uL (ref 4.22–5.81)
RDW: 13.4 % (ref 11.5–15.5)
WBC: 6.8 10*3/uL (ref 4.0–10.5)

## 2023-04-19 LAB — COMPREHENSIVE METABOLIC PANEL
ALT: 30 U/L (ref 0–53)
AST: 25 U/L (ref 0–37)
Albumin: 4.7 g/dL (ref 3.5–5.2)
Alkaline Phosphatase: 58 U/L (ref 39–117)
BUN: 12 mg/dL (ref 6–23)
CO2: 28 meq/L (ref 19–32)
Calcium: 9.9 mg/dL (ref 8.4–10.5)
Chloride: 104 meq/L (ref 96–112)
Creatinine, Ser: 1.13 mg/dL (ref 0.40–1.50)
GFR: 80.95 mL/min (ref >60.00)
Glucose, Bld: 87 mg/dL (ref 70–99)
Potassium: 4.4 meq/L (ref 3.5–5.1)
Sodium: 140 meq/L (ref 135–145)
Total Bilirubin: 0.5 mg/dL (ref 0.2–1.2)
Total Protein: 7.3 g/dL (ref 6.0–8.3)

## 2023-04-19 LAB — HEMOGLOBIN A1C: Hgb A1c MFr Bld: 5.4 % (ref 4.6–6.5)

## 2023-04-19 LAB — LIPID PANEL
Cholesterol: 219 mg/dL — ABNORMAL HIGH (ref 0–200)
HDL: 62.1 mg/dL (ref >39.00)
LDL Cholesterol: 145 mg/dL — ABNORMAL HIGH (ref 0–99)
NonHDL: 156.47
Total CHOL/HDL Ratio: 4
Triglycerides: 59 mg/dL (ref 0.0–149.0)
VLDL: 11.8 mg/dL (ref 0.0–40.0)

## 2023-04-19 LAB — TSH: TSH: 1.22 u[IU]/mL (ref 0.35–5.50)

## 2023-04-19 MED ORDER — METFORMIN HCL ER 500 MG PO TB24: 500.0000 mg | ORAL_TABLET | Freq: Every evening | ORAL | 2 refills | Status: DC

## 2023-04-19 MED ORDER — MONTELUKAST SODIUM 10 MG PO TABS: 10.0000 mg | ORAL_TABLET | Freq: Every evening | ORAL | 3 refills | Status: DC

## 2023-04-19 MED ORDER — LORATADINE 10 MG PO TABS: 10.0000 mg | ORAL_TABLET | Freq: Every day | ORAL | 3 refills | Status: AC

## 2023-04-19 MED ORDER — LISINOPRIL 40 MG PO TABS: 40.0000 mg | ORAL_TABLET | Freq: Every day | ORAL | 3 refills | Status: DC

## 2023-04-19 NOTE — Assessment & Plan Note (Signed)
Overdue cerebral aneurysm surveillance.  Advised patient to call interventional radiology to schedule follow-up.

## 2023-04-19 NOTE — Assessment & Plan Note (Signed)
Pending A1c, insulin.  Discussed trial of metformin.  Discussed side effects, mechanism of action.

## 2023-04-19 NOTE — Patient Instructions (Addendum)
You are due for follow up MRA head; please call Dr Julieanne Cotton to schedule    [facility] [address] [place], [place] [address] [phone]  Metformin is used in prediabetes, diabetes, and also for weight loss by decreasing calorie consumption.   It works in a couple of ways by decreasing liver glucose production, decreases intestinal absorption of glucose and improves insulin sensitivity (increases peripheral glucose uptake and utilization).     Please make sure that you titrate per below so not to cause any GI upset.    Start metformin XR with one 500mg  tablet at night. After one week, you may increase to two tablets at night ( total of 1000mg ) . The third week, you may take take two tablets at night and one tablet in the morning.  The fourth week, you may take two tablets in the morning ( 1000mg  total) and two tablets at night (1000mg  total). This will bring you to a maximum daily dose of 2000mg /day which is maximum dose.  So you are aware,  you may take ALL 4 tablets of metformin together at the same time if preferable and doesn't cause GI upset. You may take metformin 2000mg  ( four of the 500mg  tablets) together in the morning or at night if you prefer.   Along the way, if you want to increase more slowly, please do as this medication can cause GI discomfort and loose stools which usually get better with time , however some patients find that they can only tolerate a certain dose and cannot increase to maximum dose.

## 2023-04-19 NOTE — Progress Notes (Signed)
Assessment & Plan:  Primary hypertension Assessment & Plan: Blood pressure improved this patient rested in exam room.  Continue lisinopril 40mg  qd  Orders: -     Lipid panel -     Lisinopril; Take 1 tablet (40 mg total) by mouth daily. Appt needed for additional quantity or refills. Please call office soon for appt  Dispense: 90 tablet; Refill: 3  Allergy, subsequent encounter -     CBC with Differential/Platelet -     Comprehensive metabolic panel -     Montelukast Sodium; Take 1 tablet (10 mg total) by mouth at bedtime.  Dispense: 90 tablet; Refill: 3  Class 1 obesity due to excess calories without serious comorbidity with body mass index (BMI) of 34.0 to 34.9 in adult Assessment & Plan: Pending A1c, insulin.  Discussed trial of metformin.  Discussed side effects, mechanism of action.  Orders: -     Lipid panel -     TSH -     Hemoglobin A1c -     metFORMIN HCl ER; Take 1 tablet (500 mg total) by mouth every evening.  Dispense: 90 tablet; Refill: 2 -     Insulin, random; Future  Seasonal allergies -     Loratadine; Take 1 tablet (10 mg total) by mouth daily.  Dispense: 90 tablet; Refill: 3  Cerebral aneurysm Assessment & Plan: Overdue cerebral aneurysm surveillance.  Advised patient to call interventional radiology to schedule follow-up.      Return precautions given.   Risks, benefits, and alternatives of the medications and treatment plan prescribed today were discussed, and patient expressed understanding.   Education regarding symptom management and diagnosis given to patient on AVS either electronically or printed.  Return in about 6 months (around 10/18/2023).  [name], FNP  Subjective:    Patient ID: Jack Lewis, male    DOB: 1980/05/01, 42 y.o.   MRN: [id number]  CC: Jack Lewis is a 42 y.o. male who presents today for follow up.   HPI: Here today to discuss weight loss medication.  Insurance no longer covering Saxenda.  He had been pleased  with saxenda  and lost approx 30 lbs. .  He continues to workout regularly including weightlifting.  He is not lifting as heavy of weights since diagnosis of cerebral aneurysm  Goal weight 200lbs to 195 lbs.      Consult with Julieanne Cotton 03/07/21 with repeat MRA brain 6 months MRA head 11/12/21-Stable examination. Subtle 2 mm outpouching projecting posterior from the supraclinoid/paraclinoid ICA on the left.  Allergies: Patient has no known allergies. Current Outpatient Medications on File Prior to Visit  Medication Sig Dispense Refill   cyclobenzaprine (FLEXERIL) 10 MG tablet Take 1 tablet (10 mg total) by mouth 3 (three) times daily as needed. 30 tablet 0   No current facility-administered medications on file prior to visit.    Review of Systems  Constitutional:  Negative for chills and fever.  Respiratory:  Negative for cough.   Cardiovascular:  Negative for chest pain and palpitations.  Gastrointestinal:  Negative for nausea and vomiting.      Objective:    BP 120/85   Pulse 63   Temp (!) 97.4 F (36.3 C) (Oral)   Ht 5\' 7"  (1.702 m)   Wt 230 lb 3.2 oz (104.4 kg)   SpO2 98%   BMI 36.05 kg/m  BP Readings from Last 3 Encounters:  04/19/23 120/85  01/22/23 134/71  06/10/22 126/82   Wt Readings from  Last 3 Encounters:  04/19/23 230 lb 3.2 oz (104.4 kg)  06/10/22 233 lb (105.7 kg)  12/09/20 205 lb 12.8 oz (93.4 kg)    Physical Exam Vitals reviewed.  Constitutional:      Appearance: He is well-developed.  Cardiovascular:     Rate and Rhythm: Regular rhythm.     Heart sounds: Normal heart sounds.  Pulmonary:     Effort: Pulmonary effort is normal. No respiratory distress.     Breath sounds: Normal breath sounds. No wheezing, rhonchi or rales.  Skin:    General: Skin is warm and dry.  Neurological:     Mental Status: He is alert.  Psychiatric:        Speech: Speech normal.        Behavior: Behavior normal.

## 2023-04-19 NOTE — Assessment & Plan Note (Signed)
Blood pressure improved this patient rested in exam room.  Continue lisinopril 40mg  qd

## 2023-10-06 IMAGING — XA IR ANGIO INTRA EXTRACRAN SEL COM CAROTID INNOMINATE BILAT MOD SE
2 series · 12 of 24 positions shown · IV contrast (IODINE)
Comparison: MRI MRA of the brain July 12, 2020.

CLINICAL DATA: History of worsening headaches primarily in the left
temporoparietal region. MRA of the brain revealed outpouching in the
left paraclinoid region of the internal carotid artery.

EXAM:
BILATERAL COMMON CAROTID AND INNOMINATE ANGIOGRAPHY
TECHNIQUE: Informed written consent was obtained from the patient after a
thorough discussion of the procedural risks, benefits and
alternatives. All questions were addressed. Maximal Sterile Barrier
Technique was utilized including caps, mask, sterile gowns, sterile
gloves, sterile drape, hand hygiene and skin antiseptic. A timeout
was performed prior to the initiation of the procedure.

[Series 8: cerebral · 2 acquisitions, 2 frames shown]
[im 1/2]
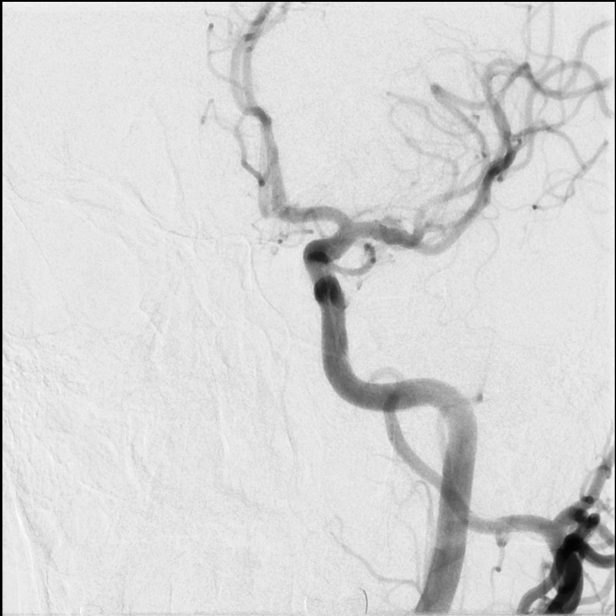
[im 1/2]
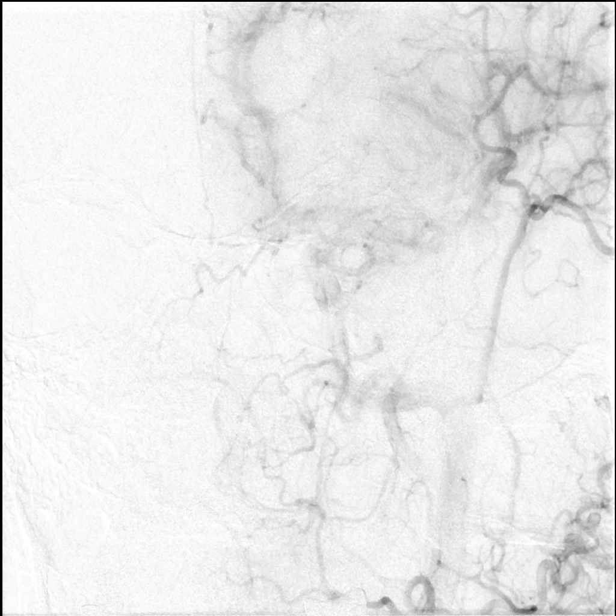

[Series 300: ir angio intra extracran sel com carotid · 10 of 218 slices shown]
[im 20/218]
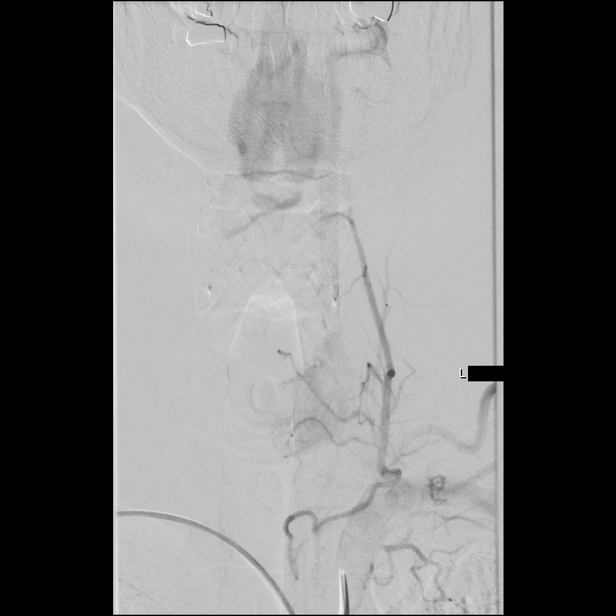
[im 40/218]
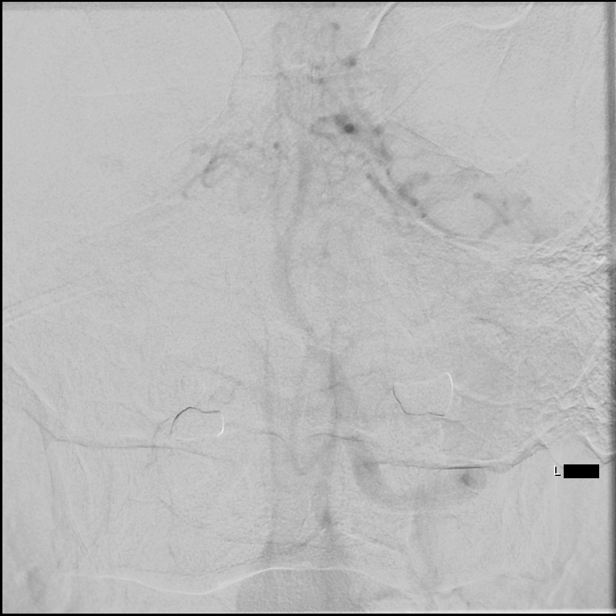
[im 60/218]
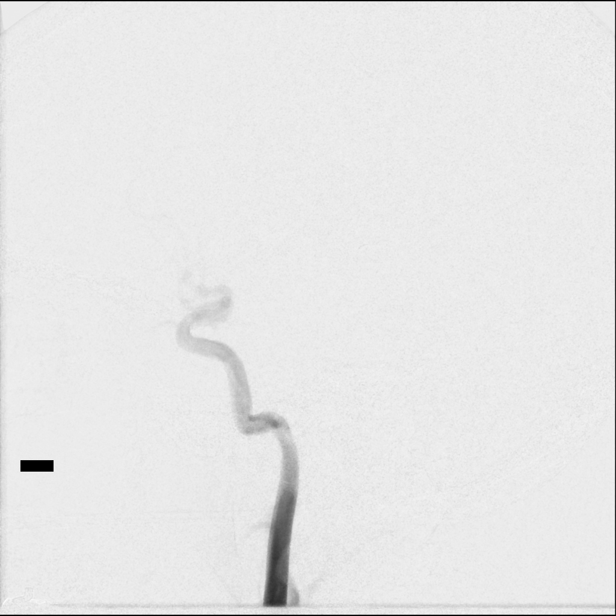
[im 79/218]
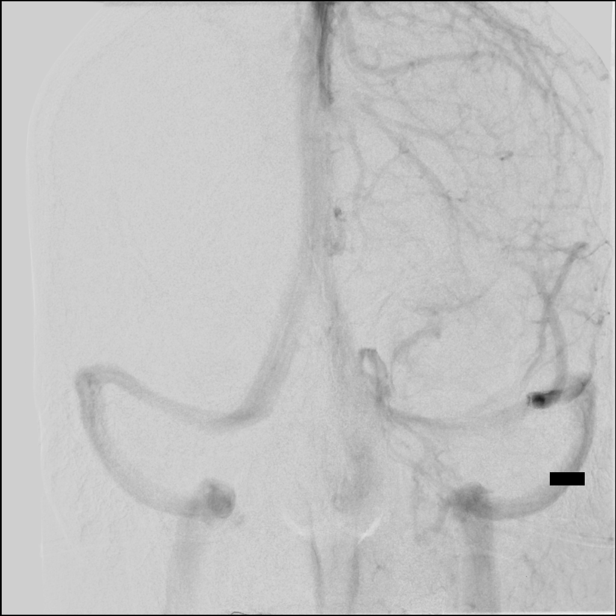
[im 109/218]
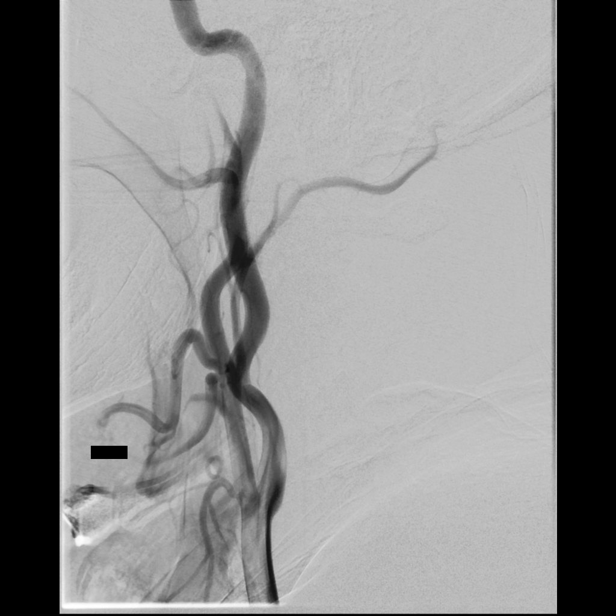
[im 129/218]
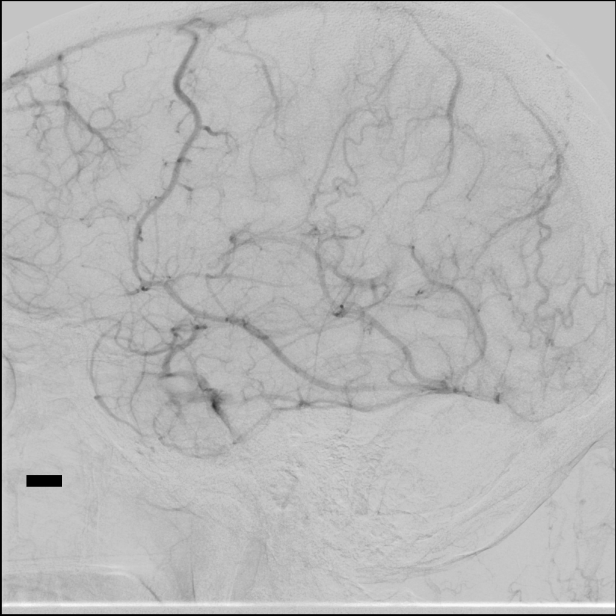
[im 148/218]
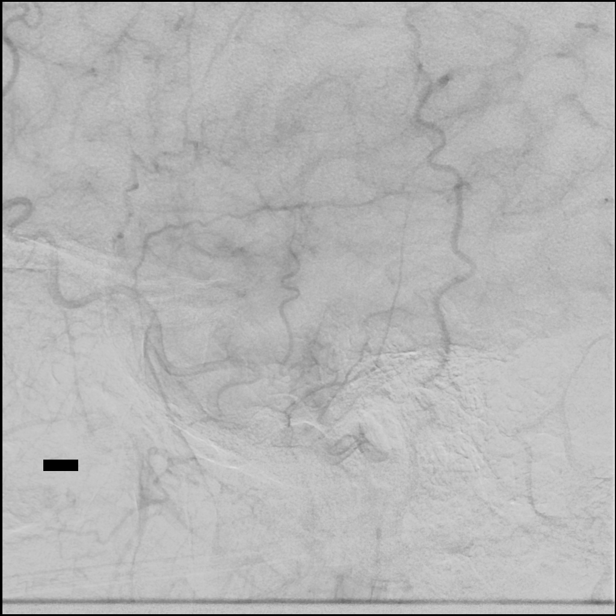
[im 168/218]
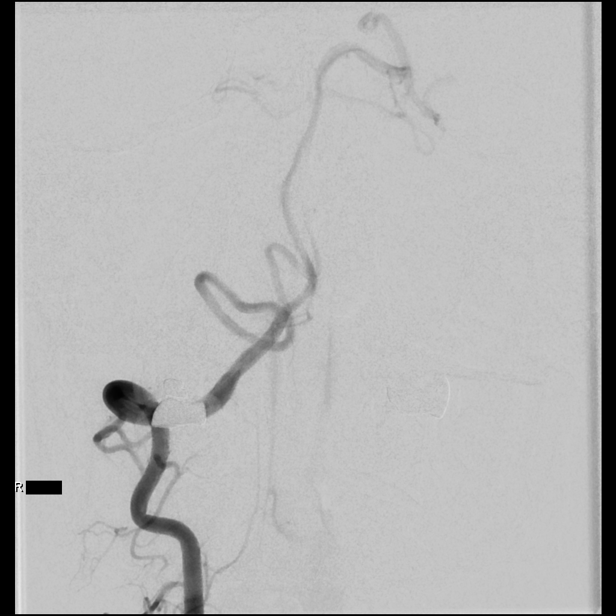
[im 198/218]
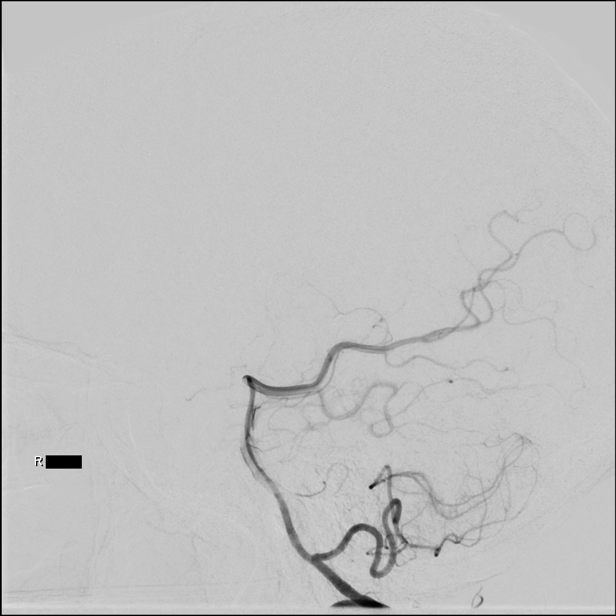
[im 218/218]
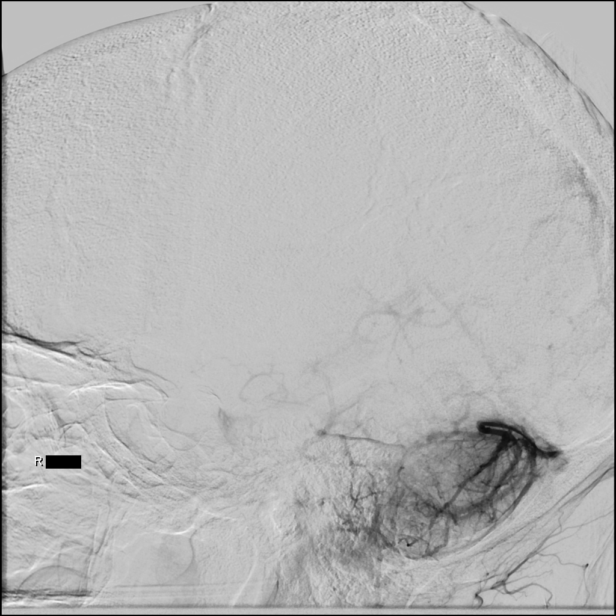

[12 of 24 positions shown; findings below may reference images not displayed]

MEDICATIONS:
Heparin 8111 units IA. No antibiotic was administered within 1 hour
of the procedure.

ANESTHESIA/SEDATION:
Versed 1 mg IV; Fentanyl 25 mcg IV

Moderate Sedation Time:  40 minutes

The patient was continuously monitored during the procedure by the
interventional radiology nurse under my direct supervision.

CONTRAST:  Omnipaque 300 approximately 65 mL.

FLUOROSCOPY TIME:  Fluoroscopy Time: 8 minutes 56 seconds (7107
mGy).

COMPLICATIONS:
None immediate.
The right forearm to the wrist was prepped and draped in the usual
sterile manner.

The right radial artery was then identified with ultrasound, and its
morphology documented. A dorsal palmar anastomosis was verified to
present. Using ultrasound guidance and micropuncture set access into
the right radial artery was obtained over a 0.018 inch micro
guidewire.

A [DATE] French radial sheath was then inserted. The micro guidewire,
and the obturator were removed. Good aspiration was obtained from
side port of the radial sheath. A cocktail of 8111 units of heparin,
2.5 mg of verapamil, and 200 mcg of nitroglycerin was then instilled
in diluted form without event. A right radial arteriogram was then
performed. Over a 0.035 inch Roadrunner guidewire, Roberto Tiger 2
diagnostic catheter was advanced to the aortic arch region, and
select positioning in the right common carotid artery, the left
common carotid artery, the right vertebral artery, and the left
vertebral artery.

A wrist band was applied for hemostasis at the right radial puncture
site. Distal right radial pulse was verified to be present.
FINDINGS: The left vertebral artery origin is widely patent.

The vessel is seen to opacify to the cranial skull base. Wide
patency is maintained of the left vertebrobasilar junction and the
left posterior-inferior cerebellar artery.

The basilar artery, the left posterior cerebral arteries, the
superior cerebellar arteries and the anterior-inferior cerebellar
arteries opacify into the capillary and venous phases. Unopacified
blood is seen in the basilar artery from the contralateral vertebral
artery.

The left common carotid arteriogram demonstrates the left external
carotid artery and its major branches to be widely patent.

The left internal carotid artery at the bulb to the cranial skull
base is widely patent.

The petrous, the cavernous and the supraclinoid segments are widely
patent.

A focal outpouching is seen in the left paraclinoid ICA projecting
posteriorly and slightly inferiorly. No vessel is seen emanating
from its fundus.

A 3D rotational arteriogram was then performed via the left common
carotid artery projecting onto the left anterior intra cerebral
circulation.

Reformations were performed on a separate workstation.

The presence of an approximately 2.6 mm x 2.2 mm wide neck saccular
aneurysm is evident. A left anterior choroidal artery is also noted.

The left middle and the left anterior cerebral artery opacify into
the capillary and venous phases.

Prompt cross filling via the anterior communicating artery of the
right anterior cerebral A2 segment is seen.

A right common carotid arteriogram demonstrates the right external
carotid artery and its branches to be widely patent.

The right internal carotid artery at the bulb to the cranial skull
base is widely patent.

The petrous, cavernous and supraclinoid segments remain well
opacified. A right posterior communicating artery is seen opacifying
the right posterior cerebral artery distribution.

The right middle cerebral artery and the right anterior cerebral
artery opacify into the capillary and venous phases.

The right vertebral artery origin is widely patent.

The vessel is seen to opacify to the cranial skull base. Patency is
seen of the right posterior-inferior cerebellar artery and the right
vertebrobasilar junction. Opacification of the basilar artery, the
left posterior cerebral artery, the superior cerebellar artery and
the left anterior-inferior cerebellar artery is noted with mixing of
unopacified blood from the more dominant left vertebral artery as
mentioned above.
IMPRESSION: Approximately 2.6 mm x 2.2 mm wide neck saccular outpouching most
likely an aneurysm in the left ICA paraclinoid region projecting
from the posterior wall.

PLAN:
Findings reviewed with the patient and the patient's spouse.

## 2023-10-15 ENCOUNTER — Encounter (HOSPITAL_COMMUNITY): Payer: Self-pay | Admitting: Interventional Radiology

## 2024-02-02 ENCOUNTER — Other Ambulatory Visit: Payer: Self-pay | Admitting: Family

## 2024-02-02 DIAGNOSIS — E66811 Obesity, class 1: Secondary | ICD-10-CM

## 2024-02-04 ENCOUNTER — Other Ambulatory Visit: Payer: Self-pay

## 2024-02-04 ENCOUNTER — Other Ambulatory Visit: Payer: Self-pay | Admitting: Family

## 2024-02-04 DIAGNOSIS — E6609 Other obesity due to excess calories: Secondary | ICD-10-CM

## 2024-02-04 MED ORDER — METFORMIN HCL ER 500 MG PO TB24: ORAL_TABLET | ORAL | 0 refills | Status: DC

## 2024-05-01 ENCOUNTER — Other Ambulatory Visit: Payer: Self-pay | Admitting: Family

## 2024-05-01 DIAGNOSIS — T7840XD Allergy, unspecified, subsequent encounter: Secondary | ICD-10-CM

## 2024-05-01 DIAGNOSIS — I10 Essential (primary) hypertension: Secondary | ICD-10-CM

## 2024-05-15 ENCOUNTER — Ambulatory Visit: Admitting: Family

## 2024-05-15 NOTE — Progress Notes (Unsigned)
 Virtual Visit via Video Note  I connected with Jack Lewis on 05/15/24 at 12:30 PM EST by a video enabled telemedicine application and verified that I am speaking with the correct person using two identifiers. Location patient: home Location provider: work  Persons participating in the virtual visit: patient, provider  I discussed the limitations of evaluation and management by telemedicine and the availability of in person appointments. The patient expressed understanding and agreed to proceed.  HPI:   ROS: See pertinent positives and negatives per HPI.  EXAM:  VITALS per patient if applicable: There were no vitals taken for this visit. BP Readings from Last 3 Encounters:  04/19/23 120/85  01/22/23 134/71  06/10/22 126/82   Wt Readings from Last 3 Encounters:  04/19/23 230 lb 3.2 oz (104.4 kg)  06/10/22 233 lb (105.7 kg)  12/09/20 205 lb 12.8 oz (93.4 kg)    GENERAL: alert, oriented, appears well and in no acute distress  HEENT: atraumatic, conjunttiva clear, no obvious abnormalities on inspection of external nose and ears  NECK: normal movements of the head and neck  LUNGS: on inspection no signs of respiratory distress, breathing rate appears normal, no obvious gross SOB, gasping or wheezing  CV: no obvious cyanosis  MS: moves all visible extremities without noticeable abnormality  PSYCH/NEURO: pleasant and cooperative, no obvious depression or anxiety, speech and thought processing grossly intact  ASSESSMENT AND PLAN: There are no diagnoses linked to this encounter.   -we discussed possible serious and likely etiologies, options for evaluation and workup, limitations of telemedicine visit vs in person visit, treatment, treatment risks and precautions. Pt prefers to treat via telemedicine empirically rather then risking or undertaking an in person visit at this moment.    I discussed the assessment and treatment plan with the patient. The patient was provided an  opportunity to ask questions and all were answered. The patient agreed with the plan and demonstrated an understanding of the instructions.   The patient was advised to call back or seek an in-person evaluation if the symptoms worsen or if the condition fails to improve as anticipated.  Advised if desired AVS can be mailed or viewed via MyChart if Mychart user.   Rollene Northern, FNP

## 2024-05-20 ENCOUNTER — Other Ambulatory Visit: Payer: Self-pay | Admitting: Family

## 2024-05-20 DIAGNOSIS — I10 Essential (primary) hypertension: Secondary | ICD-10-CM

## 2024-05-20 DIAGNOSIS — T7840XD Allergy, unspecified, subsequent encounter: Secondary | ICD-10-CM

## 2024-05-23 ENCOUNTER — Other Ambulatory Visit: Payer: Self-pay | Admitting: Family

## 2024-05-23 ENCOUNTER — Telehealth: Payer: Self-pay

## 2024-05-23 DIAGNOSIS — I10 Essential (primary) hypertension: Secondary | ICD-10-CM

## 2024-05-23 DIAGNOSIS — T7840XD Allergy, unspecified, subsequent encounter: Secondary | ICD-10-CM

## 2024-05-23 NOTE — Telephone Encounter (Signed)
 Copied from CRM #[id number]. Topic: Clinical - Medication Refill >> May 23, 2024  3:05 PM Hadassah PARAS wrote: Medication: lisinopril  (ZESTRIL ) 40 MG tablet; montelukast  (SINGULAIR ) 10 MG tablet   Has the patient contacted their pharmacy? No (Agent: If no, request that the patient contact the pharmacy for the refill. If patient does not wish to contact the pharmacy document the reason why and proceed with request.) (Agent: If yes, when and what did the pharmacy advise?)  This is the patient's preferred pharmacy:  [id number] - [place], [place] - [address] AT [facility] [address] [facility] [address] [facility] [address] [place] [address] Phone: [phone] Fax: [phone]      Is this the correct pharmacy for this prescription? Yes If no, delete pharmacy and type the correct one.   Has the prescription been filled recently? Yes  Is the patient out of the medication? Yes  Has the patient been seen for an appointment in the last year OR does the patient have an upcoming appointment? Yes  Can we respond through MyChart? Yes  Agent: Please be advised that Rx refills may take up to 3 business days. We ask that you follow-up with your pharmacy. >> May 23, 2024  3:10 PM Hadassah PARAS wrote: Would like a courtesy fill until next app

## 2024-05-23 NOTE — Telephone Encounter (Signed)
 Refills have been sent to local pharmacy.

## 2024-06-12 ENCOUNTER — Ambulatory Visit: Payer: Self-pay | Admitting: Family
